# Patient Record
Sex: Male | Born: 1970
Health system: Southern US, Community
[De-identification: ages and names within clinical notes are randomized; demographics above are authoritative.]

---

## 2015-05-02 ENCOUNTER — Ambulatory Visit (INDEPENDENT_AMBULATORY_CARE_PROVIDER_SITE_OTHER): Payer: BLUE CROSS/BLUE SHIELD | Admitting: Emergency Medicine

## 2015-05-02 VITALS — BP 118/86 | HR 66 | Temp 98.1°F | Resp 16 | Ht 72.0 in | Wt 173.0 lb

## 2015-05-02 DIAGNOSIS — H6123 Impacted cerumen, bilateral: Secondary | ICD-10-CM

## 2015-05-02 NOTE — Progress Notes (Signed)
Subjective:  Patient ID: Samuel Kim, male    DOB: [birth year]  Age: 44 y.o. MRN: [id number]  CC: Ear Fullness   HPI Samuel PanningKevin Barretto presents  for decreased hearing in his left ear. He denies any drainage fever chills nasal congestion postnasal drip cough. Has a history of prior cerumen impactions and has attempted to irrigate his left ear with peroxide water. He's had no improvement. He has no pain in his ear.  History [name] has no past medical history on file.   He has no past surgical history on file.   His  family history is not on file.  He   reports that he has been smoking.  He does not have any smokeless tobacco history on file. His alcohol and drug histories are not on file.  No outpatient prescriptions prior to visit.   No facility-administered medications prior to visit.    History   Social History  . Marital Status: Single    Spouse Name: N/A  . Number of Children: N/A  . Years of Education: N/A   Social History Main Topics  . Smoking status: Current Every Day Smoker  . Smokeless tobacco: Not on file  . Alcohol Use: Not on file  . Drug Use: Not on file  . Sexual Activity: Not on file   Other Topics Concern  . None   Social History Narrative  . None     Review of Systems  Constitutional: Negative for fever, chills and appetite change.  HENT: Negative for congestion, ear pain, postnasal drip, sinus pressure and sore throat.   Eyes: Negative for pain and redness.  Respiratory: Negative for cough, shortness of breath and wheezing.   Cardiovascular: Negative for leg swelling.  Gastrointestinal: Negative for nausea, vomiting, abdominal pain, diarrhea, constipation and blood in stool.  Endocrine: Negative for polyuria.  Genitourinary: Negative for dysuria, urgency, frequency and flank pain.  Musculoskeletal: Negative for gait problem.  Skin: Negative for rash.  Neurological: Negative for weakness and headaches.  Psychiatric/Behavioral: Negative for  confusion and decreased concentration. The patient is not nervous/anxious.     Objective:  BP 118/86 mmHg  Pulse 66  Temp(Src) 98.1 F (36.7 C)  Resp 16  Ht 6' (1.829 m)  Wt 173 lb (78.472 kg)  BMI 23.46 kg/m2  SpO2 98%  Physical Exam  Constitutional: He is oriented to person, place, and time. He appears well-developed and well-nourished. No distress.  HENT:  Head: Normocephalic and atraumatic.  Right Ear: External ear normal.  Left Ear: External ear normal.  Nose: Nose normal.  Eyes: Conjunctivae and EOM are normal. Pupils are equal, round, and reactive to light. No scleral icterus.  Neck: Normal range of motion. Neck supple. No tracheal deviation present.  Cardiovascular: Normal rate, regular rhythm and normal heart sounds.   Pulmonary/Chest: Effort normal. No respiratory distress. He has no wheezes. He has no rales.  Abdominal: He exhibits no mass. There is no tenderness. There is no rebound and no guarding.  Musculoskeletal: He exhibits no edema.  Lymphadenopathy:    He has no cervical adenopathy.  Neurological: He is alert and oriented to person, place, and time. Coordination normal.  Skin: Skin is warm and dry. No rash noted.  Psychiatric: He has a normal mood and affect. His behavior is normal.   was noted to have bilateral cerumen impactions    Assessment & Plan:   [name] was seen today for ear fullness.  Diagnoses and all orders for this visit:  Cerumen impaction,  bilateral   Mr. Tapp does not currently have medications on file.  No orders of the defined types were placed in this encounter.   His ears were bilaterally irrigated atraumatically. No complications  Appropriate red flag conditions were discussed with the patient as well as actions that should be taken.  Patient expressed his understanding.  Follow-up: Return if symptoms worsen or fail to improve.  Carmelina Dane, MD

## 2015-05-02 NOTE — Patient Instructions (Signed)
Cerumen Impaction °A cerumen impaction is when the wax in your ear forms a plug. This plug usually causes reduced hearing. Sometimes it also causes an earache or dizziness. Removing a cerumen impaction can be difficult and painful. The wax sticks to the ear canal. The canal is sensitive and bleeds easily. If you try to remove a heavy wax buildup with a cotton tipped swab, you may push it in further. °Irrigation with water, suction, and small ear curettes may be used to clear out the wax. If the impaction is fixed to the skin in the ear canal, ear drops may be needed for a few days to loosen the wax. People who build up a lot of wax frequently can use ear wax removal products available in your local drugstore. °SEEK MEDICAL CARE IF:  °You develop an earache, increased hearing loss, or marked dizziness. °Document Released: 11/11/2004 Document Revised: 12/27/2011 Document Reviewed: 01/01/2010 °[employer]® Patient Information ©2015 [employer]. This information is not intended to replace advice given to you by your health care provider. Make sure you discuss any questions you have with your health care provider. ° °

## 2015-05-06 ENCOUNTER — Ambulatory Visit: Payer: Self-pay | Admitting: Adult Health

## 2018-12-04 DIAGNOSIS — M25551 Pain in right hip: Secondary | ICD-10-CM | POA: Diagnosis not present

## 2018-12-11 DIAGNOSIS — M25551 Pain in right hip: Secondary | ICD-10-CM | POA: Diagnosis not present

## 2018-12-14 ENCOUNTER — Ambulatory Visit: Payer: BLUE CROSS/BLUE SHIELD | Admitting: Physician Assistant

## 2018-12-20 DIAGNOSIS — M25551 Pain in right hip: Secondary | ICD-10-CM | POA: Diagnosis not present

## 2019-01-02 DIAGNOSIS — F17219 Nicotine dependence, cigarettes, with unspecified nicotine-induced disorders: Secondary | ICD-10-CM | POA: Diagnosis not present

## 2019-01-02 DIAGNOSIS — Z01818 Encounter for other preprocedural examination: Secondary | ICD-10-CM | POA: Diagnosis not present

## 2019-01-02 DIAGNOSIS — R030 Elevated blood-pressure reading, without diagnosis of hypertension: Secondary | ICD-10-CM | POA: Diagnosis not present

## 2019-01-02 DIAGNOSIS — M1611 Unilateral primary osteoarthritis, right hip: Secondary | ICD-10-CM | POA: Diagnosis not present

## 2019-03-29 DIAGNOSIS — I10 Essential (primary) hypertension: Secondary | ICD-10-CM | POA: Diagnosis not present

## 2019-03-29 DIAGNOSIS — R51 Headache: Secondary | ICD-10-CM | POA: Diagnosis not present

## 2019-03-29 DIAGNOSIS — R6883 Chills (without fever): Secondary | ICD-10-CM | POA: Diagnosis not present

## 2019-04-01 ENCOUNTER — Other Ambulatory Visit: Payer: Self-pay

## 2019-04-01 ENCOUNTER — Emergency Department (HOSPITAL_COMMUNITY): Payer: BC Managed Care – PPO

## 2019-04-01 ENCOUNTER — Encounter (HOSPITAL_COMMUNITY): Payer: Self-pay | Admitting: *Deleted

## 2019-04-01 ENCOUNTER — Emergency Department (HOSPITAL_COMMUNITY)
Admission: EM | Admit: 2019-04-01 | Discharge: 2019-04-01 | Disposition: A | Discharge status: Home / Self Care | Payer: BC Managed Care – PPO | Attending: Emergency Medicine | Admitting: Emergency Medicine

## 2019-04-01 DIAGNOSIS — F17200 Nicotine dependence, unspecified, uncomplicated: Secondary | ICD-10-CM | POA: Diagnosis not present

## 2019-04-01 DIAGNOSIS — R112 Nausea with vomiting, unspecified: Secondary | ICD-10-CM

## 2019-04-01 DIAGNOSIS — R945 Abnormal results of liver function studies: Secondary | ICD-10-CM | POA: Diagnosis not present

## 2019-04-01 DIAGNOSIS — R7989 Other specified abnormal findings of blood chemistry: Secondary | ICD-10-CM | POA: Diagnosis not present

## 2019-04-01 DIAGNOSIS — R5383 Other fatigue: Secondary | ICD-10-CM | POA: Diagnosis not present

## 2019-04-01 DIAGNOSIS — Z20828 Contact with and (suspected) exposure to other viral communicable diseases: Secondary | ICD-10-CM | POA: Insufficient documentation

## 2019-04-01 DIAGNOSIS — R5381 Other malaise: Secondary | ICD-10-CM | POA: Diagnosis not present

## 2019-04-01 DIAGNOSIS — R748 Abnormal levels of other serum enzymes: Secondary | ICD-10-CM | POA: Diagnosis not present

## 2019-04-01 DIAGNOSIS — R509 Fever, unspecified: Secondary | ICD-10-CM

## 2019-04-01 DIAGNOSIS — R51 Headache: Secondary | ICD-10-CM | POA: Diagnosis not present

## 2019-04-01 LAB — COMPREHENSIVE METABOLIC PANEL
ALT: 87 U/L — ABNORMAL HIGH (ref 0–44)
AST: 132 U/L — ABNORMAL HIGH (ref 15–41)
Albumin: 3 g/dL — ABNORMAL LOW (ref 3.5–5.0)
Alkaline Phosphatase: 247 U/L — ABNORMAL HIGH (ref 38–126)
Anion gap: 10 (ref 5–15)
BUN: 10 mg/dL (ref 6–20)
CO2: 26 mmol/L (ref 22–32)
Calcium: 8.2 mg/dL — ABNORMAL LOW (ref 8.9–10.3)
Chloride: 95 mmol/L — ABNORMAL LOW (ref 98–111)
Creatinine, Ser: 0.93 mg/dL (ref 0.61–1.24)
GFR calc Af Amer: >60 mL/min (ref >60)
GFR calc non Af Amer: >60 mL/min (ref >60)
Glucose, Bld: 104 mg/dL — ABNORMAL HIGH (ref 70–99)
Potassium: 3.8 mmol/L (ref 3.5–5.1)
Sodium: 131 mmol/L — ABNORMAL LOW (ref 135–145)
Total Bilirubin: 2.1 mg/dL — ABNORMAL HIGH (ref 0.3–1.2)
Total Protein: 6.1 g/dL — ABNORMAL LOW (ref 6.5–8.1)

## 2019-04-01 LAB — CBC WITH DIFFERENTIAL/PLATELET
Abs Immature Granulocytes: 0 10*3/uL (ref 0.00–0.07)
Basophils Absolute: 0 10*3/uL (ref 0.0–0.1)
Basophils Relative: 1 %
Eosinophils Absolute: 0 10*3/uL (ref 0.0–0.5)
Eosinophils Relative: 0 %
HCT: 49.9 % (ref 39.0–52.0)
Hemoglobin: 17.3 g/dL — ABNORMAL HIGH (ref 13.0–17.0)
Lymphocytes Relative: 28 %
Lymphs Abs: 1.3 10*3/uL (ref 0.7–4.0)
MCH: 33.7 pg (ref 26.0–34.0)
MCHC: 34.7 g/dL (ref 30.0–36.0)
MCV: 97.3 fL (ref 80.0–100.0)
Monocytes Absolute: 0.1 10*3/uL (ref 0.1–1.0)
Monocytes Relative: 3 %
Neutro Abs: 3.1 10*3/uL (ref 1.7–7.7)
Neutrophils Relative %: 68 %
Platelets: 41 10*3/uL — ABNORMAL LOW (ref 150–400)
RBC: 5.13 MIL/uL (ref 4.22–5.81)
RDW: 13.2 % (ref 11.5–15.5)
WBC: 4.6 10*3/uL (ref 4.0–10.5)
nRBC: 0 % (ref 0.0–0.2)

## 2019-04-01 LAB — ACETAMINOPHEN LEVEL: Acetaminophen (Tylenol), Serum: <10 ug/mL — ABNORMAL LOW (ref 10–30)

## 2019-04-01 LAB — PROTIME-INR
INR: 1.1 (ref 0.8–1.2)
Prothrombin Time: 13.8 seconds (ref 11.4–15.2)

## 2019-04-01 LAB — SARS CORONAVIRUS 2: SARS Coronavirus 2: NOT DETECTED

## 2019-04-01 LAB — LIPASE, BLOOD: Lipase: 22 U/L (ref 11–51)

## 2019-04-01 MED ORDER — SODIUM CHLORIDE 0.9 % IV BOLUS
1000.0000 mL | Freq: Once | INTRAVENOUS | Status: AC
  Administered 2019-04-01: 18:00:00 1000 mL via INTRAVENOUS

## 2019-04-01 NOTE — ED Provider Notes (Signed)
Physical Exam  BP 114/78 (BP Location: Right Arm)   Pulse 64   Temp 98.2 F (36.8 C) (Oral)   Resp 18   SpO2 97%   Assumed care from Dr. Venora Maples at [phone]. Please see his note for full H & P. Briefly, the patient is a 48 y.o. male with PMHx of  has no past medical history on file. here with subjective fever, headache, malaise.   Labs Reviewed  CBC WITH DIFFERENTIAL/PLATELET - Abnormal; Notable for the following components:      Result Value   Hemoglobin 17.3 (*)    Platelets 41 (*)    All other components within normal limits  COMPREHENSIVE METABOLIC PANEL - Abnormal; Notable for the following components:   Sodium 131 (*)    Chloride 95 (*)    Glucose, Bld 104 (*)    Calcium 8.2 (*)    Total Protein 6.1 (*)    Albumin 3.0 (*)    AST 132 (*)    ALT 87 (*)    Alkaline Phosphatase 247 (*)    Total Bilirubin 2.1 (*)    All other components within normal limits  SARS CORONAVIRUS 2 (HOSPITAL ORDER, [place] [facility])  LIPASE, BLOOD    Course of Care:   Physical Exam Vitals signs and nursing note reviewed.  Constitutional:      General: He is not in acute distress.    Appearance: He is well-developed. He is not diaphoretic.     Comments: Sitting comfortably in bed.  HENT:     Head: Normocephalic and atraumatic.  Eyes:     General:        Right eye: No discharge.        Left eye: No discharge.     Conjunctiva/sclera: Conjunctivae normal.     Comments: EOMs normal to gross examination.  Neck:     Musculoskeletal: Normal range of motion.  Cardiovascular:     Rate and Rhythm: Normal rate and regular rhythm.     Comments: Intact, 2+ radial pulse. Pulmonary:     Effort: Pulmonary effort is normal.     Breath sounds: Normal breath sounds. No wheezing.  Abdominal:     General: There is no distension.     Tenderness: There is no guarding or rebound.  Musculoskeletal: Normal range of motion.  Skin:    General: Skin is warm and dry.  Neurological:   Mental Status: He is alert.     Comments: Cranial nerves intact to gross observation. Patient moves extremities without difficulty.  Psychiatric:        Behavior: Behavior normal.        Thought Content: Thought content normal.        Judgment: Judgment normal.     ED Course/Procedures   Clinical Course as of Mar 31 2221  Sun Apr 01, 2019  2017 Hemoglobin(!): 17.3 [AM]  2017 Platelets(!): 41 [AM]  2116 Prothrombin Time: 13.8 [AM]  2215 SARS Coronavirus 2: NOT DETECTED [AM]  2215 Labs reviewed with Dr. Darl Householder including platelets, liver function and given patient's well appearance, he can follow up with OP after hepatitis panel is drawn.    [AM]    Clinical Course User Index [AM] Albesa Seen, PA-C    Procedures  MDM   This is a well-appearing 48 year old male presenting for fever, malaise, headache, and a couple episodes of nausea vomiting this week.  He is hemodynamically stable and afebrile here in the emergency department.  Work-up  demonstrating normal WBC count.  Hemoglobin elevated at 17.3, possibly secondary to volume depletion.  He is given fluids here in emergency department.  He has thrombocytopenia of 41. Presumed new since pt had labwork in March out of system.  WBC morphology showing plasmacytoid lymphs.  Patient has hyponatremia of 131 likely secondary to volume depletion.  This was repleted.  He has hypocalcemia of 8.2, hypoproteinemia of 6.1, hypoalbuminemia of 3.0.  AST is 132 and ALT 87.  Alkaline phosphatase 247.  Total bili 2.1.  He has a normal RUQ US.  Tylenol level checked and is normal.  SARS-CoV-2 test is normal.  Lipase normal.  PT/INR normal.  Patient reports feeling much better after fluids.    Case discussed with Dr. Chaney Mallingavid Yao, and patient felt appropriate for outpatient therapy after acute hepatitis panel ordered.  He will follow-up as an outpatient.  He is given return precautions for any intractable nausea or vomiting, or new or worsening symptoms.   Patient is in understanding and agrees with plan of care.       Elisha PonderMurray, Shakaya Bhullar B, PA-C 04/01/19 2226    Charlynne PanderYao, David Hsienta, MD 04/04/19 [phone]

## 2019-04-01 NOTE — ED Notes (Signed)
US at bedside

## 2019-04-01 NOTE — ED Provider Notes (Signed)
[facility] EMERGENCY DEPARTMENT Provider Note   CSN: [id number] Arrival date & time: 04/01/19  1620     History   Chief Complaint Chief Complaint  Patient presents with  . Fever  . Weakness    HPI Samuel Kim is a 48 y.o. male.     HPI 48 year old male presents the emergency department with complaints of generalized malaise and fever since Tuesday.  His fever abated after several days and today he has had recurrence of his chills.  He denies cough or congestion.  No shortness of breath.  Denies abdominal pain.  Reports poor appetite secondary to nausea associated with food.  Denies abdominal pain.  No vomiting.  Denies diarrhea.  No blood in the stool.  No sick contacts.  Otherwise healthy 48 year old male without significant past medical history.  No known sick contacts.   History reviewed. No pertinent past medical history.  There are no active problems to display for this patient.   History reviewed. No pertinent surgical history.      Home Medications    Prior to Admission medications   Not on File    Family History History reviewed. No pertinent family history.  Social History Social History   Tobacco Use  . Smoking status: Current Every Day Smoker  Substance Use Topics  . Alcohol use: Not on file  . Drug use: Not on file     Allergies   Patient has no known allergies.   Review of Systems Review of Systems  All other systems reviewed and are negative.    Physical Exam Updated Vital Signs BP 114/78 (BP Location: Right Arm)   Pulse 64   Temp 98.2 F (36.8 C) (Oral)   Resp 18   SpO2 97%   Physical Exam Vitals signs and nursing note reviewed.  Constitutional:      Appearance: He is well-developed.  HENT:     Head: Normocephalic and atraumatic.  Neck:     Musculoskeletal: Normal range of motion.  Cardiovascular:     Rate and Rhythm: Normal rate and regular rhythm.     Heart sounds: Normal heart sounds.   Pulmonary:     Effort: Pulmonary effort is normal. No respiratory distress.     Breath sounds: Normal breath sounds.  Abdominal:     General: There is no distension.     Palpations: Abdomen is soft.     Tenderness: There is no abdominal tenderness.  Musculoskeletal: Normal range of motion.  Skin:    General: Skin is warm and dry.  Neurological:     Mental Status: He is alert and oriented to person, place, and time.  Psychiatric:        Judgment: Judgment normal.      ED Treatments / Results  Labs (all labs ordered are listed, but only abnormal results are displayed) Labs Reviewed  CBC WITH DIFFERENTIAL/PLATELET  COMPREHENSIVE METABOLIC PANEL  LIPASE, BLOOD    EKG    Radiology No results found.  Procedures Procedures (including critical care time)  Medications Ordered in ED Medications  sodium chloride 0.9 % bolus 1,000 mL (has no administration in time range)     Initial Impression / Assessment and Plan / ED Course  I have reviewed the triage vital signs and the nursing notes.  Pertinent labs & imaging results that were available during my care of the patient were reviewed by me and considered in my medical decision making (see chart for details).  Labs chest x-ray and IV fluids now.  If all is reassuring the patient is overall well-appearing he likely can be discharged home.  Given the viral-like symptoms we will check him for COVID-19 with outpatient COVID-19 testing.  Home quarantine instructions will be given.  Marcy PanningKevin Kim was evaluated in Emergency Department on 04/01/2019 for the symptoms described in the history of present illness. He was evaluated in the context of the global COVID-19 pandemic, which necessitated consideration that the patient might be at risk for infection with the SARS-CoV-2 virus that causes COVID-19. Institutional protocols and algorithms that pertain to the evaluation of patients at risk for COVID-19 are in a state of rapid  change based on information released by regulatory bodies including the CDC and federal and state organizations. These policies and algorithms were followed during the patient's care in the ED.   Final Clinical Impressions(s) / ED Diagnoses   Final diagnoses:  None    ED Discharge Orders    None       Azalia Bilisampos, William, MD 04/01/19 [phone]

## 2019-04-01 NOTE — ED Notes (Signed)
ED Provider at bedside. 

## 2019-04-01 NOTE — ED Notes (Signed)
Patient is alert and orientedx4.  Patient was explained discharge instructions and they understood them with no questions.   

## 2019-04-01 NOTE — ED Triage Notes (Signed)
Pt reports onset of headache, fever, and fatigue on wed.

## 2019-04-01 NOTE — Discharge Instructions (Addendum)
Please see the information and instructions below regarding your visit.  Your diagnoses today include:  1. Fever in adult   2. Elevated liver enzymes   3. Non-intractable vomiting with nausea, unspecified vomiting type     You are diagnosed today with a viral respiratory illness. One of the major respiratory illnesses of concern circulating in our community is COVID-19. The Novel Coronavirus 2019, was first reported on in [place], [place] in late December 2019.  The outbreak was declared a public health emergency of international concern in January 2020 and on March 11th, 2020, the outbreak was declared a global pandemic.  The spread of this virus is now global with lots of media attention.  The virus has been named SARS-CoV-2 and the disease it causes has become known as coronavirus disease 2019 (COVID-19). Although symptoms can be variable, the predominant symptoms are fever, cough, and shortness of breath.   The incubation period (period during which the patient has the disease and can spread it to others but does not have symptoms of disease) is from 5-14 days.   [place] is considered to have "community spread" of COVID-19. This means that we are seeing cases of COVID-19 that we cannot connect to other known positive cases. Please refer to the information below on Home Care and from the [facility] to guide you on keeping your family safe and returning to normal activity.  Your test was negative today however I feel it is reasonable to continue to complete the rest of quarantine COVID.  Tests performed today include: See side panel of your discharge paperwork for testing performed today. Vital signs are listed at the bottom of these instructions.   Medications prescribed:    Take any prescribed medications only as prescribed, and any over the counter medications only as directed on the packaging.  DO NOT TAKE TYLENOL UNTIL WE FINISH EVALUATING YOUR LIVER.     You may take ibuprofen or naproxen.  Home care instructions:  Please follow any educational materials contained in this packet.   Since we do not have the ability to widely test patients for COVID-19, the Centers for Disease Control and Prevention (CDC) recommend the following guidelines currently for self-quarantine during a respiratory illness:   -At least 3 days (72 hours) have passed since recovery defined as resolution of fever without the use of fever-reducing medications and improvement in respiratory symptoms (e.g., cough, shortness of breath); and, -At least 10 days have passed since symptoms first appeared.  Your illness is contagious and can be spread to others, especially during the phase of illness during which you have a fever. It cannot be cured by antibiotics or other medicines. Take basic precautions such as washing your hands often, covering your mouth when you cough or sneeze, and avoiding public places where you could spread your illness to others.   Please continue drinking plenty of fluids.  Use over-the-counter medicines as needed as directed on packaging for symptom relief.  You may also use ibuprofen or tylenol as directed on packaging for pain or fever.  Do not take multiple medicines containing Tylenol or acetaminophen to avoid taking too much of this medication.  Follow-up instructions:   Return instructions:  Please return to the Emergency Department if you experience worsening symptoms.  RETURN IMMEDIATELY IF you develop shortness of breath, chest pain, confusion or altered mental status, a new rash, become dizzy, faint, or poorly responsive, or are unable to be cared for at  home. Some patients develop bacterial pneumonia as a result of viral respiratory infections. Signs and symptoms include return of fever, increased production of green or yellow phlegm, chest pain, shortness of breath. You need to be reevaluated if you experience these symptoms. Please return  if you have persistent vomiting and cannot keep down fluids or develop a fever that is not controlled by Acetaminophen (Tylenol) or Ibuprofen (Motrin). Please return if you have any other emergent concerns.   Additional Information:   Your vital signs today were: BP 113/79    Pulse 76    Temp 98.2 F (36.8 C) (Oral)    Resp 18    SpO2 94%  If your blood pressure (BP) was elevated on multiple readings during this visit above 130 for the top number or above 80 for the bottom number, please have this repeated by your primary care provider within one month. --------------  Thank you for allowing us to participate in your care today.        Individuals who are confirmed to have, or are being evaluated for, COVID-19 should follow the prevention steps below until a healthcare provider or local or state health department says they can return to normal activities.  Stay home except to get medical care You should restrict activities outside your home, except for getting medical care. Do not go to work, school, or public areas, and do not use public transportation or taxis.  Call ahead before visiting your doctor Before your medical appointment, call the healthcare provider and tell them that you have, or are being evaluated for, COVID-19 infection. This will help the healthcare providers office take steps to keep other people from getting infected. Ask your healthcare provider to call the local or state health department.  Monitor your symptoms Seek prompt medical attention if your illness is worsening (e.g., difficulty breathing). Before going to your medical appointment, call the healthcare provider and tell them that you have, or are being evaluated for, COVID-19 infection. Ask your healthcare provider to call the local or state health department.  Wear a facemask You should wear a facemask that covers your nose and mouth when you are in the same room with other people and when you  visit a healthcare provider. People who live with or visit you should also wear a facemask while they are in the same room with you.  Separate yourself from other people in your home As much as possible, you should stay in a different room from other people in your home. Also, you should use a separate bathroom, if available.  Avoid sharing household items You should not share dishes, drinking glasses, cups, eating utensils, towels, bedding, or other items with other people in your home. After using these items, you should wash them thoroughly with soap and water.  Cover your coughs and sneezes Cover your mouth and nose with a tissue when you cough or sneeze, or you can cough or sneeze into your sleeve. Throw used tissues in a lined trash can, and immediately wash your hands with soap and water for at least 20 seconds or use an alcohol-based hand rub.  Wash your Union Pacific Corporationhands Wash your hands often and thoroughly with soap and water for at least 20 seconds. You can use an alcohol-based hand sanitizer if soap and water are not available and if your hands are not visibly dirty. Avoid touching your eyes, nose, and mouth with unwashed hands.   Prevention Steps for Caregivers and Household Members of Individuals Confirmed  to have, or Being Evaluated for, COVID-19 Infection Being Cared for in the Home  If you live with, or provide care at home for, a person confirmed to have, or being evaluated for, COVID-19 infection please follow these guidelines to prevent infection:  Follow healthcare providers instructions Make sure that you understand and can help the patient follow any healthcare provider instructions for all care.  Provide for the patients basic needs You should help the patient with basic needs in the home and provide support for getting groceries, prescriptions, and other personal needs.  Monitor the patients symptoms If they are getting sicker, call his or her medical provider and  tell them that the patient has, or is being evaluated for, COVID-19 infection. This will help the healthcare providers office take steps to keep other people from getting infected. Ask the healthcare provider to call the local or state health department.  Limit the number of people who have contact with the patient If possible, have only one caregiver for the patient. Other household members should stay in another home or place of residence. If this is not possible, they should stay in another room, or be separated from the patient as much as possible. Use a separate bathroom, if available. Restrict visitors who do not have an essential need to be in the home.  Keep older adults, very young children, and other sick people away from the patient Keep older adults, very young children, and those who have compromised immune systems or chronic health conditions away from the patient. This includes people with chronic heart, lung, or kidney conditions, diabetes, and cancer.  Ensure good ventilation Make sure that shared spaces in the home have good air flow, such as from an air conditioner or an opened window, weather permitting.  Wash your hands often Wash your hands often and thoroughly with soap and water for at least 20 seconds. You can use an alcohol based hand sanitizer if soap and water are not available and if your hands are not visibly dirty. Avoid touching your eyes, nose, and mouth with unwashed hands. Use disposable paper towels to dry your hands. If not available, use dedicated cloth towels and replace them when they become wet.  Wear a facemask and gloves Wear a disposable facemask at all times in the room and gloves when you touch or have contact with the patients blood, body fluids, and/or secretions or excretions, such as sweat, saliva, sputum, nasal mucus, vomit, urine, or feces.  Ensure the mask fits over your nose and mouth tightly, and do not touch it during use. Throw out  disposable facemasks and gloves after using them. Do not reuse. Wash your hands immediately after removing your facemask and gloves. If your personal clothing becomes contaminated, carefully remove clothing and launder. Wash your hands after handling contaminated clothing. Place all used disposable facemasks, gloves, and other waste in a lined container before disposing them with other household waste. Remove gloves and wash your hands immediately after handling these items.  Do not share dishes, glasses, or other household items with the patient Avoid sharing household items. You should not share dishes, drinking glasses, cups, eating utensils, towels, bedding, or other items with a patient who is confirmed to have, or being evaluated for, COVID-19 infection. After the person uses these items, you should wash them thoroughly with soap and water.  Wash laundry thoroughly Immediately remove and wash clothes or bedding that have blood, body fluids, and/or secretions or excretions, such as sweat, saliva,  sputum, nasal mucus, vomit, urine, or feces, on them. Wear gloves when handling laundry from the patient. Read and follow directions on labels of laundry or clothing items and detergent. In general, wash and dry with the warmest temperatures recommended on the label.  Clean all areas the individual has used often Clean all touchable surfaces, such as counters, tabletops, doorknobs, bathroom fixtures, toilets, phones, keyboards, tablets, and bedside tables, every day. Also, clean any surfaces that may have blood, body fluids, and/or secretions or excretions on them. Wear gloves when cleaning surfaces the patient has come in contact with. Use a diluted bleach solution (e.g., dilute bleach with 1 part bleach and 10 parts water) or a household disinfectant with a label that says EPA-registered for coronaviruses. To make a bleach solution at home, add 1 tablespoon of bleach to 1 quart (4 cups) of water.  For a larger supply, add  cup of bleach to 1 gallon (16 cups) of water. Read labels of cleaning products and follow recommendations provided on product labels. Labels contain instructions for safe and effective use of the cleaning product including precautions you should take when applying the product, such as wearing gloves or eye protection and making sure you have good ventilation during use of the product. Remove gloves and wash hands immediately after cleaning.  Monitor yourself for signs and symptoms of illness Caregivers and household members are considered close contacts, should monitor their health, and will be asked to limit movement outside of the home to the extent possible. Follow the monitoring steps for close contacts listed on the symptom monitoring form.   ? If you have additional questions, contact your local health department or call the epidemiologist on call at [phone] (available 24/7). ? This guidance is subject to change. For the most up-to-date guidance from [facility], please refer to their website: TripMetro.huhttps://www.cdc.gov/coronavirus/2019-ncov/hcp/guidance-prevent-spread.html

## 2019-04-03 LAB — HEPATITIS PANEL, ACUTE
HCV Ab: <0.1 s/co ratio (ref 0.0–0.9)
Hep A IgM: NEGATIVE
Hep B C IgM: NEGATIVE
Hepatitis B Surface Ag: NEGATIVE

## 2019-04-09 ENCOUNTER — Encounter: Payer: Self-pay | Admitting: Family Medicine

## 2019-04-09 ENCOUNTER — Ambulatory Visit: Payer: BC Managed Care – PPO | Admitting: Family Medicine

## 2019-04-09 ENCOUNTER — Other Ambulatory Visit: Payer: Self-pay

## 2019-04-09 VITALS — BP 138/79 | HR 67 | Temp 98.4°F | Resp 16 | Ht 72.0 in | Wt 182.4 lb

## 2019-04-09 DIAGNOSIS — R12 Heartburn: Secondary | ICD-10-CM

## 2019-04-09 DIAGNOSIS — Z13220 Encounter for screening for lipoid disorders: Secondary | ICD-10-CM

## 2019-04-09 DIAGNOSIS — E860 Dehydration: Secondary | ICD-10-CM | POA: Diagnosis not present

## 2019-04-09 DIAGNOSIS — R7401 Elevation of levels of liver transaminase levels: Secondary | ICD-10-CM

## 2019-04-09 DIAGNOSIS — R740 Nonspecific elevation of levels of transaminase and lactic acid dehydrogenase [LDH]: Secondary | ICD-10-CM

## 2019-04-09 LAB — POCT URINALYSIS DIP (MANUAL ENTRY)
Blood, UA: NEGATIVE
Glucose, UA: NEGATIVE mg/dL
Leukocytes, UA: NEGATIVE
Nitrite, UA: NEGATIVE
Protein Ur, POC: NEGATIVE mg/dL
Spec Grav, UA: >=1.03 — AB (ref 1.010–1.025)
Urobilinogen, UA: 0.2 E.U./dL
pH, UA: 5 (ref 5.0–8.0)

## 2019-04-09 NOTE — Patient Instructions (Addendum)
   If you have lab work done today you will be contacted with your lab results within the next 2 weeks.  If you have not heard from us then please contact us. The fastest way to get your results is to register for My Chart.   IF you received an x-ray today, you will receive an invoice from [facility]. Please contact [facility] at [phone] with questions or concerns regarding your invoice.   IF you received labwork today, you will receive an invoice from [facility]. Please contact [facility] at [phone] with questions or concerns regarding your invoice.   Our billing staff will not be able to assist you with questions regarding bills from these companies.  You will be contacted with the lab results as soon as they are available. The fastest way to get your results is to activate your My Chart account. Instructions are located on the last page of this paperwork. If you have not heard from us regarding the results in 2 weeks, please contact this office.     Cholesterol Cholesterol is a white, waxy, fat-like substance that is needed by the human body in small amounts. The liver makes all the cholesterol we need. Cholesterol is carried from the liver by the blood through the blood vessels. Deposits of cholesterol (plaques) may build up on blood vessel (artery) walls. Plaques make the arteries narrower and stiffer. Cholesterol plaques increase the risk for heart attack and stroke. You cannot feel your cholesterol level even if it is very high. The only way to know that it is high is to have a blood test. Once you know your cholesterol levels, you should keep a record of the test results. Work with your health care provider to keep your levels in the desired range. What do the results mean?  Total cholesterol is a rough measure of all the cholesterol in your blood.  LDL (low-density lipoprotein) is the "bad" cholesterol. This is the type that causes plaque to build up on the  artery walls. You want this level to be low.  HDL (high-density lipoprotein) is the "good" cholesterol because it cleans the arteries and carries the LDL away. You want this level to be high.  Triglycerides are fat that the body can either burn for energy or store. High levels are closely linked to heart disease. What are the desired levels of cholesterol?  Total cholesterol below 200.  LDL below 100 for people who are at risk, below 70 for people at very high risk.  HDL above 40 is good. A level of 60 or higher is considered to be protective against heart disease.  Triglycerides below 150. How can I lower my cholesterol? Diet Follow your diet program as told by your health care provider.  Choose fish or white meat chicken and turkey, roasted or baked. Limit fatty cuts of red meat, fried foods, and processed meats, such as sausage and lunch meats.  Eat lots of fresh fruits and vegetables.  Choose whole grains, beans, pasta, potatoes, and cereals.  Choose olive oil, corn oil, or canola oil, and use only small amounts.  Avoid butter, mayonnaise, shortening, or palm kernel oils.  Avoid foods with trans fats.  Drink skim or nonfat milk and eat low-fat or nonfat yogurt and cheeses. Avoid whole milk, cream, ice cream, egg yolks, and full-fat cheeses.  Healthier desserts include angel food cake, ginger snaps, animal crackers, hard candy, popsicles, and low-fat or nonfat frozen yogurt. Avoid pastries, cakes, pies, and cookies.    Exercise  Follow your exercise program as told by your health care provider. A regular program: ? Helps to decrease LDL and raise HDL. ? Helps with weight control.  Do things that increase your activity level, such as gardening, walking, and taking the stairs.  Ask your health care provider about ways that you can be more active in your daily life. Medicine  Take over-the-counter and prescription medicines only as told by your health care  provider. ? Medicine may be prescribed by your health care provider to help lower cholesterol and decrease the risk for heart disease. This is usually done if diet and exercise have failed to bring down cholesterol levels. ? If you have several risk factors, you may need medicine even if your levels are normal. This information is not intended to replace advice given to you by your health care provider. Make sure you discuss any questions you have with your health care provider. Document Released: 06/29/2001 Document Revised: 05/01/2016 Document Reviewed: 04/03/2016 Elsevier Interactive Patient Education  2019 Elsevier Inc.  

## 2019-04-09 NOTE — Progress Notes (Signed)
New Patient Office Visit  Subjective:  Patient ID: Samuel Kim, male    DOB: [birth year]  Age: 48 y.o. MRN: [id number]  CC:  Chief Complaint  Patient presents with  . Hospitalization Follow-up    per pt feeling better since getting iv fluids, per pt he took a lot of goodie powders, tylenol and ibuprofen and thinks this caused his liver enzymes to be elevated    HPI Samuel Kim presents for   Pt had a fever and vomiting on 04/01/2019 He went to [facility] ER  States that he vomited twice He started off having headache and a fever and took handfull of pills. His headache has resolved He took some goodies and bc powder and states that he did not want to take too much He was told to alternate ibuprofe and tylenol His fever was better He did not have much energy He states that he was feeling sick after being out and mowing the yard   He denies chest pains, diarrhea, no hematuria  He states that his urine was a copper color  Since 04/01/2019 He is drinking water, gatorade and stopped the pedialyte   No past medical history on file.  No past surgical history on file.  No family history on file.  Social History   Socioeconomic History  . Marital status: Single    Spouse name: Not on file  . Number of children: Not on file  . Years of education: Not on file  . Highest education level: Not on file  Occupational History  . Not on file  Social Needs  . Financial resource strain: Not on file  . Food insecurity    Worry: Not on file    Inability: Not on file  . Transportation needs    Medical: Not on file    Non-medical: Not on file  Tobacco Use  . Smoking status: Current Every Day Smoker  . Smokeless tobacco: Never Used  Substance and Sexual Activity  . Alcohol use: Not on file  . Drug use: Not on file  . Sexual activity: Not on file  Lifestyle  . Physical activity    Days per week: Not on file    Minutes per session: Not on file  . Stress: Not on file   Relationships  . Social Herbalist on phone: Not on file    Gets together: Not on file    Attends religious service: Not on file    Active member of club or organization: Not on file    Attends meetings of clubs or organizations: Not on file    Relationship status: Not on file  . Intimate partner violence    Fear of current or ex partner: Not on file    Emotionally abused: Not on file    Physically abused: Not on file    Forced sexual activity: Not on file  Other Topics Concern  . Not on file  Social History Narrative  . Not on file    ROS Review of Systems Review of Systems  Constitutional: Negative for activity change, appetite change, chills and fever.  HENT: Negative for congestion, nosebleeds, trouble swallowing and voice change.   Respiratory: Negative for cough, shortness of breath and wheezing.   Gastrointestinal: see hpi  Genitourinary: Negative for difficulty urinating, dysuria, flank pain and hematuria.   See hpi Musculoskeletal: Negative for back pain, joint swelling and neck pain.  Neurological: Negative for dizziness, speech difficulty, light-headedness and numbness.  See HPI. All other review of systems negative.   Objective:   Today's Vitals: BP 138/79 (BP Location: Right Arm, Patient Position: Sitting, Cuff Size: Normal)   Pulse 67   Temp 98.4 F (36.9 C) (Oral)   Resp 16   Ht 6' (1.829 m)   Wt 182 lb 6.4 oz (82.7 kg)   SpO2 96%   BMI 24.74 kg/m   Physical Exam  Physical Exam  Constitutional: Oriented to person, place, and time. Appears well-developed and well-nourished.  HENT:  Head: Normocephalic and atraumatic.  Eyes: Conjunctivae and EOM are normal.  Cardiovascular: Normal rate, regular rhythm, normal heart sounds and intact distal pulses.  No murmur heard. Pulmonary/Chest: Effort normal and breath sounds normal. No stridor. No respiratory distress. Has no wheezes.  Abdomen: nondistended, normoactive bs, nontender, no masses   Neurological: Is alert and oriented to person, place, and time.  Skin: Skin is warm. Capillary refill takes less than 2 seconds.  Psychiatric: Has a normal mood and affect. Behavior is normal. Judgment and thought content normal.    FINDINGS: Gallbladder:  No gallstones or wall thickening visualized. No sonographic Murphy sign noted by sonographer.  Common bile duct:  Diameter: 2.8 mm, within normal limits  Liver:  No focal lesion identified. Within normal limits in parenchymal echogenicity. Portal vein is patent on color Doppler imaging with normal direction of blood flow towards the liver.  IMPRESSION: Negative right upper quadrant ultrasound.   Electronically Signed   By: San Morelle M.D.   On: 04/01/2019 21:32  Problem List Items Addressed This Visit    None    Visit Diagnoses    Elevated transaminase level    -  Primary Reviewed labs such as hepatitis labs also reviewed CMP Discussed that will also check lipids    Relevant Orders   CMP14+EGFR   CBC   Lipid panel   Heartburn    -  Discussed that he should avoid aspirin for now, may need antacids   Relevant Orders   CMP14+EGFR   CBC   Dehydration    -  Discussed that will need to assess lytes   Relevant Orders   CMP14+EGFR   CBC   POCT urinalysis dipstick   Screening, lipid    - will screen today, pt does not go to the doctor   Relevant Orders   Lipid panel      Outpatient Encounter Medications as of 04/09/2019  Medication Sig  . acetaminophen (TYLENOL) 500 MG tablet Take 1,000 mg by mouth every 6 (six) hours as needed for fever or headache (pain).  . Aspirin-Acetaminophen-Caffeine (GOODY HEADACHE PO) Take 1 packet by mouth 4 (four) times daily as needed (pain/headache/fever).  . Aspirin-Salicylamide-Caffeine (BC HEADACHE POWDER PO) Take 1 packet by mouth 4 (four) times daily as needed (pain/headache/fever).  Marland Kitchen ibuprofen (ADVIL) 200 MG tablet Take 800 mg by mouth every 6 (six) hours as  needed for fever or headache (pain).   No facility-administered encounter medications on file as of 04/09/2019.     Follow-up: No follow-ups on file.   Forrest Moron, MD

## 2019-04-10 LAB — CMP14+EGFR
ALT: 28 IU/L (ref 0–44)
AST: 21 IU/L (ref 0–40)
Albumin/Globulin Ratio: 1.8 (ref 1.2–2.2)
Albumin: 4.2 g/dL (ref 4.0–5.0)
Alkaline Phosphatase: 171 IU/L — ABNORMAL HIGH (ref 39–117)
BUN/Creatinine Ratio: 11 (ref 9–20)
BUN: 8 mg/dL (ref 6–24)
Bilirubin Total: 0.7 mg/dL (ref 0.0–1.2)
CO2: 20 mmol/L (ref 20–29)
Calcium: 8.8 mg/dL (ref 8.7–10.2)
Chloride: 105 mmol/L (ref 96–106)
Creatinine, Ser: 0.72 mg/dL — ABNORMAL LOW (ref 0.76–1.27)
GFR calc Af Amer: 128 mL/min/{1.73_m2} (ref >59)
GFR calc non Af Amer: 111 mL/min/{1.73_m2} (ref >59)
Globulin, Total: 2.3 g/dL (ref 1.5–4.5)
Glucose: 80 mg/dL (ref 65–99)
Potassium: 4.2 mmol/L (ref 3.5–5.2)
Sodium: 141 mmol/L (ref 134–144)
Total Protein: 6.5 g/dL (ref 6.0–8.5)

## 2019-04-10 LAB — LIPID PANEL
Chol/HDL Ratio: 6.6 ratio — ABNORMAL HIGH (ref 0.0–5.0)
Cholesterol, Total: 158 mg/dL (ref 100–199)
HDL: 24 mg/dL — ABNORMAL LOW (ref >39)
LDL Calculated: 57 mg/dL (ref 0–99)
Triglycerides: 386 mg/dL — ABNORMAL HIGH (ref 0–149)
VLDL Cholesterol Cal: 77 mg/dL — ABNORMAL HIGH (ref 5–40)

## 2019-04-10 LAB — CBC
Hematocrit: 48.2 % (ref 37.5–51.0)
Hemoglobin: 16.6 g/dL (ref 13.0–17.7)
MCH: 33.5 pg — ABNORMAL HIGH (ref 26.6–33.0)
MCHC: 34.4 g/dL (ref 31.5–35.7)
MCV: 97 fL (ref 79–97)
Platelets: 208 10*3/uL (ref 150–450)
RBC: 4.95 x10E6/uL (ref 4.14–5.80)
RDW: 13.2 % (ref 11.6–15.4)
WBC: 8.7 10*3/uL (ref 3.4–10.8)

## 2019-06-23 DIAGNOSIS — H04123 Dry eye syndrome of bilateral lacrimal glands: Secondary | ICD-10-CM | POA: Diagnosis not present

## 2019-06-23 DIAGNOSIS — H40033 Anatomical narrow angle, bilateral: Secondary | ICD-10-CM | POA: Diagnosis not present

## 2019-08-24 ENCOUNTER — Other Ambulatory Visit: Payer: Self-pay

## 2019-08-24 DIAGNOSIS — Z20822 Contact with and (suspected) exposure to covid-19: Secondary | ICD-10-CM

## 2019-08-25 LAB — NOVEL CORONAVIRUS, NAA: SARS-CoV-2, NAA: NOT DETECTED

## 2019-10-22 ENCOUNTER — Encounter (HOSPITAL_COMMUNITY): Payer: Self-pay | Admitting: Emergency Medicine

## 2019-10-22 ENCOUNTER — Emergency Department (HOSPITAL_COMMUNITY): Payer: BC Managed Care – PPO

## 2019-10-22 ENCOUNTER — Emergency Department (HOSPITAL_COMMUNITY)
Admission: EM | Admit: 2019-10-22 | Discharge: 2019-10-23 | Disposition: A | Discharge status: Home / Self Care | Payer: BC Managed Care – PPO | Attending: Emergency Medicine | Admitting: Emergency Medicine

## 2019-10-22 DIAGNOSIS — Z7982 Long term (current) use of aspirin: Secondary | ICD-10-CM | POA: Diagnosis not present

## 2019-10-22 DIAGNOSIS — K219 Gastro-esophageal reflux disease without esophagitis: Secondary | ICD-10-CM | POA: Diagnosis not present

## 2019-10-22 DIAGNOSIS — R0789 Other chest pain: Secondary | ICD-10-CM

## 2019-10-22 DIAGNOSIS — R0602 Shortness of breath: Secondary | ICD-10-CM | POA: Diagnosis not present

## 2019-10-22 DIAGNOSIS — F17210 Nicotine dependence, cigarettes, uncomplicated: Secondary | ICD-10-CM | POA: Insufficient documentation

## 2019-10-22 LAB — CBC
HCT: 49.7 % (ref 39.0–52.0)
Hemoglobin: 17.3 g/dL — ABNORMAL HIGH (ref 13.0–17.0)
MCH: 34.7 pg — ABNORMAL HIGH (ref 26.0–34.0)
MCHC: 34.8 g/dL (ref 30.0–36.0)
MCV: 99.6 fL (ref 80.0–100.0)
Platelets: 153 10*3/uL (ref 150–400)
RBC: 4.99 MIL/uL (ref 4.22–5.81)
RDW: 12.9 % (ref 11.5–15.5)
WBC: 7.5 10*3/uL (ref 4.0–10.5)
nRBC: 0 % (ref 0.0–0.2)

## 2019-10-22 LAB — BASIC METABOLIC PANEL
Anion gap: 12 (ref 5–15)
BUN: 6 mg/dL (ref 6–20)
CO2: 24 mmol/L (ref 22–32)
Calcium: 9.3 mg/dL (ref 8.9–10.3)
Chloride: 100 mmol/L (ref 98–111)
Creatinine, Ser: 0.88 mg/dL (ref 0.61–1.24)
GFR calc Af Amer: >60 mL/min (ref >60)
GFR calc non Af Amer: >60 mL/min (ref >60)
Glucose, Bld: 106 mg/dL — ABNORMAL HIGH (ref 70–99)
Potassium: 3.4 mmol/L — ABNORMAL LOW (ref 3.5–5.1)
Sodium: 136 mmol/L (ref 135–145)

## 2019-10-22 LAB — TROPONIN I (HIGH SENSITIVITY): Troponin I (High Sensitivity): 3 ng/L (ref <18)

## 2019-10-22 MED ORDER — SODIUM CHLORIDE 0.9% FLUSH: 3.0000 mL | Freq: Once | INTRAVENOUS | Status: DC

## 2019-10-22 NOTE — ED Triage Notes (Signed)
Pt. Stated, I feel like Im SOB and some chest tightness , started about a week ago. Nothing causes to be better or worse.

## 2019-10-23 LAB — TROPONIN I (HIGH SENSITIVITY): Troponin I (High Sensitivity): 4 ng/L (ref <18)

## 2019-10-23 NOTE — ED Notes (Signed)
Pt verbalized understanding of discharge paperwork and follow-up care.  °

## 2019-10-23 NOTE — ED Provider Notes (Signed)
[facility] EMERGENCY DEPARTMENT Provider Note   CSN: [id number] Arrival date & time: 10/22/19  1833     History Chief Complaint  Patient presents with   Shortness of Breath   Chest Pain    Samuel Kim is a 49 y.o. male with a past medical history of tobacco use presenting to the ED with a chief complaint of chest pain.  About 1 week ago started having intermittent burning sensation in his chest with associated chest tightness.  He initially thought it was due to reflux and started taking Prilosec.  He believes the Prilosec was helping until yesterday when his chest tightness became constant. He also picked up an epinephrine inhaler that was available over-the-counter with some improvement in his symptoms.  He reports shortness of breath when the chest pain worsens.  Denies any history of DVT, PE, MI, recent immobilization or surgeries, fever, abdominal pain, nausea, vomiting.  He denies any drug use and reports occasional alcohol use.  Denies family history of CAD at a young age.  HPI     History reviewed. No pertinent past medical history.  There are no problems to display for this patient.   History reviewed. No pertinent surgical history.     No family history on file.  Social History   Tobacco Use   Smoking status: Current Every Day Smoker   Smokeless tobacco: Never Used  Substance Use Topics   Alcohol use: Yes    Alcohol/week: 0.0 standard drinks   Drug use: Not Currently    Home Medications Prior to Admission medications   Medication Sig Start Date End Date Taking? Authorizing Provider  acetaminophen (TYLENOL) 500 MG tablet Take 1,000 mg by mouth every 6 (six) hours as needed for fever or headache (pain).    [provider]  Aspirin-Acetaminophen-Caffeine (GOODY HEADACHE PO) Take 1 packet by mouth 4 (four) times daily as needed (pain/headache/fever).    [provider]  Aspirin-Salicylamide-Caffeine (BC HEADACHE POWDER  PO) Take 1 packet by mouth 4 (four) times daily as needed (pain/headache/fever).    [provider]  ibuprofen (ADVIL) 200 MG tablet Take 800 mg by mouth every 6 (six) hours as needed for fever or headache (pain).    [provider]    Allergies    Patient has no known allergies.  Review of Systems   Review of Systems  Constitutional: Negative for appetite change, chills and fever.  HENT: Negative for ear pain, rhinorrhea, sneezing and sore throat.   Eyes: Negative for photophobia and visual disturbance.  Respiratory: Positive for chest tightness and shortness of breath. Negative for cough and wheezing.   Cardiovascular: Positive for chest pain. Negative for palpitations.  Gastrointestinal: Negative for abdominal pain, blood in stool, constipation, diarrhea, nausea and vomiting.  Genitourinary: Negative for dysuria, hematuria and urgency.  Musculoskeletal: Negative for myalgias.  Skin: Negative for rash.  Neurological: Negative for dizziness, weakness and light-headedness.    Physical Exam Updated Vital Signs BP (!) 156/114 (BP Location: Left Arm)    Pulse 61    Temp 97.8 F (36.6 C) (Oral)    Resp 18    SpO2 99%   Physical Exam Vitals and nursing note reviewed.  Constitutional:      General: He is not in acute distress.    Appearance: He is well-developed.     Comments: Speaking in complete sentences without difficulty.  HENT:     Head: Normocephalic and atraumatic.     Nose: Nose normal.  Eyes:  General: No scleral icterus.       Left eye: No discharge.     Conjunctiva/sclera: Conjunctivae normal.  Cardiovascular:     Rate and Rhythm: Normal rate and regular rhythm.     Heart sounds: Normal heart sounds. No murmur. No friction rub. No gallop.   Pulmonary:     Effort: Pulmonary effort is normal. No respiratory distress.     Breath sounds: Normal breath sounds.  Abdominal:     General: Bowel sounds are normal. There is no distension.     Palpations:  Abdomen is soft.     Tenderness: There is no abdominal tenderness. There is no guarding.  Musculoskeletal:        General: Normal range of motion.     Cervical back: Normal range of motion and neck supple.     Right lower leg: No tenderness.     Left lower leg: No tenderness.     Comments: No lower extremity edema, erythema or calf tenderness bilaterally.  Skin:    General: Skin is warm and dry.     Findings: No rash.  Neurological:     Mental Status: He is alert.     Motor: No abnormal muscle tone.     Coordination: Coordination normal.     ED Results / Procedures / Treatments   Labs (all labs ordered are listed, but only abnormal results are displayed) Labs Reviewed  BASIC METABOLIC PANEL - Abnormal; Notable for the following components:      Result Value   Potassium 3.4 (*)    Glucose, Bld 106 (*)    All other components within normal limits  CBC - Abnormal; Notable for the following components:   Hemoglobin 17.3 (*)    MCH 34.7 (*)    All other components within normal limits  TROPONIN I (HIGH SENSITIVITY)  TROPONIN I (HIGH SENSITIVITY)    EKG EKG Interpretation  Date/Time:  Tuesday October 23 2019 10:55:16 EST Ventricular Rate:  58 PR Interval:    QRS Duration: 98 QT Interval:  450 QTC Calculation: 442 R Axis:   84 Text Interpretation: Sinus rhythm No previous tracing Confirmed by Lajean Saver [id number]) on 10/23/2019 11:09:45 AM   Radiology DG Chest 2 View  Result Date: 10/22/2019 CLINICAL DATA:  Shortness of breath and chest tightness. EXAM: CHEST - 2 VIEW COMPARISON:  April 01, 2019 FINDINGS: The heart size and mediastinal contours are within normal limits. Both lungs are clear. The visualized skeletal structures are unremarkable. IMPRESSION: No active cardiopulmonary disease. Electronically Signed   By: Virgina Norfolk M.D.   On: 10/22/2019 19:41    Procedures Procedures (including critical care time)  Medications Ordered in ED Medications  sodium  chloride flush (NS) 0.9 % injection 3 mL (has no administration in time range)    ED Course  I have reviewed the triage vital signs and the nursing notes.  Pertinent labs & imaging results that were available during my care of the patient were reviewed by me and considered in my medical decision making (see chart for details).    MDM Rules/Calculators/A&P                      49 year old male with a past medical history of tobacco abuse presenting to the ED with a chief complaint of chest pain and chest tightness.  Symptoms have been going on for the past week but got worse yesterday.  He reports shortness of breath when he has his chest  pain.  Noticed that it did get worse after eating.  He has had some improvement with Prilosec as well as an over-the-counter inhaler.  He does not currently have a PCP.  Denies family history of CAD at a young age.  On my exam he is overall well-appearing.  No lower extremity edema, erythema or calf tenderness are concerning for DVT.  Lungs are clear to auscultation bilaterally.  His vital signs are within normal limits, he is not tachycardic, tachypneic or hypoxic.  However he is persistently hypertensive here.  He states he was told in the past that he history of hypertension but has never been put on any antihypertensives.  States that intermittently he will have normal blood pressures in the office and at home.  EKG shows normal sinus rhythm without ischemic changes.  Chest x-ray is unremarkable.  Initial delta troponin are both negative.  CBC and BMP are unremarkable as well.  Patient is symptom-free today and no pain noted on my evaluation so I do not see much benefit from trialing with GI cocktail or NSAIDs.  Please that his symptoms could be due to reflux as they do occur after he eats. He is low risk for ACS, PERC negative so I doubt these as the cause of his pain.  We will have him follow-up with PCP and return for worsening symptoms.  Patient is  hemodynamically stable, in NAD, and able to ambulate in the ED. Evaluation does not show pathology that would require ongoing emergent intervention or inpatient treatment. I explained the diagnosis to the patient. Pain has been managed and has no complaints prior to discharge. Patient is comfortable with above plan and is stable for discharge at this time. All questions were answered prior to disposition. Strict return precautions for returning to the ED were discussed. Encouraged follow up with PCP.   An After Visit Summary was printed and given to the patient.   Portions of this note were generated with Scientist, clinical (histocompatibility and immunogenetics). Dictation errors may occur despite best attempts at proofreading.  Final Clinical Impression(s) / ED Diagnoses Final diagnoses:  Chest wall pain  Gastroesophageal reflux disease, unspecified whether esophagitis present    Rx / DC Orders ED Discharge Orders    None       Dietrich Pates, PA-C 10/23/19 1137    Cathren Laine, MD 10/23/19 1441

## 2019-10-23 NOTE — Discharge Instructions (Addendum)
It is important for you to establish care with a primary care provider for further evaluation and preventative management. You can take the Prilosec with Maalox as needed. Return to the ED if you start to experience worsening chest pain, shortness of breath, vomiting or coughing up blood, leg swelling.

## 2019-10-26 ENCOUNTER — Ambulatory Visit: Payer: BC Managed Care – PPO | Admitting: Family Medicine

## 2019-10-26 ENCOUNTER — Ambulatory Visit (INDEPENDENT_AMBULATORY_CARE_PROVIDER_SITE_OTHER): Payer: BC Managed Care – PPO

## 2019-10-26 ENCOUNTER — Other Ambulatory Visit: Payer: Self-pay

## 2019-10-26 ENCOUNTER — Encounter: Payer: Self-pay | Admitting: Family Medicine

## 2019-10-26 VITALS — BP 140/90 | HR 77 | Temp 98.3°F | Ht 72.0 in | Wt 173.6 lb

## 2019-10-26 DIAGNOSIS — R0781 Pleurodynia: Secondary | ICD-10-CM

## 2019-10-26 DIAGNOSIS — K219 Gastro-esophageal reflux disease without esophagitis: Secondary | ICD-10-CM | POA: Diagnosis not present

## 2019-10-26 DIAGNOSIS — R079 Chest pain, unspecified: Secondary | ICD-10-CM | POA: Diagnosis not present

## 2019-10-26 DIAGNOSIS — R0789 Other chest pain: Secondary | ICD-10-CM

## 2019-10-26 DIAGNOSIS — R072 Precordial pain: Secondary | ICD-10-CM | POA: Diagnosis not present

## 2019-10-26 MED ORDER — OMEPRAZOLE 20 MG PO CPDR: 20.0000 mg | DELAYED_RELEASE_CAPSULE | Freq: Every day | ORAL | 1 refills | Status: DC

## 2019-10-26 NOTE — Patient Instructions (Addendum)
   If you have lab work done today you will be contacted with your lab results within the next 2 weeks.  If you have not heard from us then please contact us. The fastest way to get your results is to register for My Chart.   IF you received an x-ray today, you will receive an invoice from [facility]. Please contact [facility] at [phone] with questions or concerns regarding your invoice.   IF you received labwork today, you will receive an invoice from [facility]. Please contact [facility] at [phone] with questions or concerns regarding your invoice.   Our billing staff will not be able to assist you with questions regarding bills from these companies.  You will be contacted with the lab results as soon as they are available. The fastest way to get your results is to activate your My Chart account. Instructions are located on the last page of this paperwork. If you have not heard from us regarding the results in 2 weeks, please contact this office.      Food Choices for Gastroesophageal Reflux Disease, Adult When you have gastroesophageal reflux disease (GERD), the foods you eat and your eating habits are very important. Choosing the right foods can help ease the discomfort of GERD. Consider working with a diet and nutrition specialist (dietitian) to help you make healthy food choices. What general guidelines should I follow?  Eating plan  Choose healthy foods low in fat, such as fruits, vegetables, whole grains, low-fat dairy products, and lean meat, fish, and poultry.  Eat frequent, small meals instead of three large meals each day. Eat your meals slowly, in a relaxed setting. Avoid bending over or lying down until 2-3 hours after eating.  Limit high-fat foods such as fatty meats or fried foods.  Limit your intake of oils, butter, and shortening to less than 8 teaspoons each day.  Avoid the following: ? Foods that cause symptoms. These may be different  for different people. Keep a food diary to keep track of foods that cause symptoms. ? Alcohol. ? Drinking large amounts of liquid with meals. ? Eating meals during the 2-3 hours before bed.  Cook foods using methods other than frying. This may include baking, grilling, or broiling. Lifestyle  Maintain a healthy weight. Ask your health care provider what weight is healthy for you. If you need to lose weight, work with your health care provider to do so safely.  Exercise for at least 30 minutes on 5 or more days each week, or as told by your health care provider.  Avoid wearing clothes that fit tightly around your waist and chest.  Do not use any products that contain nicotine or tobacco, such as cigarettes and e-cigarettes. If you need help quitting, ask your health care provider.  Sleep with the head of your bed raised. Use a wedge under the mattress or blocks under the bed frame to raise the head of the bed. What foods are not recommended? The items listed may not be a complete list. Talk with your dietitian about what dietary choices are best for you. Grains Pastries or quick breads with added fat. French toast. Vegetables Deep fried vegetables. French fries. Any vegetables prepared with added fat. Any vegetables that cause symptoms. For some people this may include tomatoes and tomato products, chili peppers, onions and garlic, and horseradish. Fruits Any fruits prepared with added fat. Any fruits that cause symptoms. For some people this may include citrus fruits, such as   oranges, grapefruit, pineapple, and lemons. Meats and other protein foods High-fat meats, such as fatty beef or pork, hot dogs, ribs, ham, sausage, salami and bacon. Fried meat or protein, including fried fish and fried chicken. Nuts and nut butters. Dairy Whole milk and chocolate milk. Sour cream. Cream. Ice cream. Cream cheese. Milk shakes. Beverages Coffee and tea, with or without caffeine. Carbonated beverages.  Sodas. Energy drinks. Fruit juice made with acidic fruits (such as orange or grapefruit). Tomato juice. Alcoholic drinks. Fats and oils Butter. Margarine. Shortening. Ghee. Sweets and desserts Chocolate and cocoa. Donuts. Seasoning and other foods Pepper. Peppermint and spearmint. Any condiments, herbs, or seasonings that cause symptoms. For some people, this may include curry, hot sauce, or vinegar-based salad dressings. Summary  When you have gastroesophageal reflux disease (GERD), food and lifestyle choices are very important to help ease the discomfort of GERD.  Eat frequent, small meals instead of three large meals each day. Eat your meals slowly, in a relaxed setting. Avoid bending over or lying down until 2-3 hours after eating.  Limit high-fat foods such as fatty meat or fried foods. This information is not intended to replace advice given to you by your health care provider. Make sure you discuss any questions you have with your health care provider. Document Revised: 01/25/2019 Document Reviewed: 10/05/2016 Elsevier Patient Education  2020 [employer]  
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
## 2019-11-12 ENCOUNTER — Other Ambulatory Visit: Payer: Self-pay

## 2019-11-12 ENCOUNTER — Ambulatory Visit: Payer: BC Managed Care – PPO | Admitting: Registered Nurse

## 2019-11-12 ENCOUNTER — Encounter: Payer: Self-pay | Admitting: Registered Nurse

## 2019-11-12 VITALS — BP 168/102 | HR 75 | Temp 98.2°F | Resp 16 | Ht 72.0 in | Wt 170.2 lb

## 2019-11-12 DIAGNOSIS — R202 Paresthesia of skin: Secondary | ICD-10-CM | POA: Diagnosis not present

## 2019-11-12 DIAGNOSIS — I10 Essential (primary) hypertension: Secondary | ICD-10-CM | POA: Diagnosis not present

## 2019-11-12 DIAGNOSIS — R002 Palpitations: Secondary | ICD-10-CM

## 2019-11-12 DIAGNOSIS — Z716 Tobacco abuse counseling: Secondary | ICD-10-CM

## 2019-11-12 DIAGNOSIS — M79602 Pain in left arm: Secondary | ICD-10-CM

## 2019-11-12 DIAGNOSIS — Z13220 Encounter for screening for lipoid disorders: Secondary | ICD-10-CM

## 2019-11-12 DIAGNOSIS — M79601 Pain in right arm: Secondary | ICD-10-CM | POA: Diagnosis not present

## 2019-11-12 MED ORDER — CHANTIX STARTING MONTH PAK 0.5 MG X 11 & 1 MG X 42 PO TABS: ORAL_TABLET | ORAL | 0 refills | Status: DC

## 2019-11-12 MED ORDER — AMLODIPINE BESYLATE 5 MG PO TABS: 5.0000 mg | ORAL_TABLET | Freq: Every day | ORAL | 0 refills | Status: DC

## 2019-11-12 MED ORDER — VARENICLINE TARTRATE 1 MG PO TABS: 1.0000 mg | ORAL_TABLET | Freq: Two times a day (BID) | ORAL | 1 refills | Status: DC

## 2019-11-12 NOTE — Progress Notes (Signed)
Established Patient Office Visit  Subjective:  Patient ID: Samuel Kim, male    DOB: [birth year]  Age: 49 y.o. MRN: [id number]  CC:  Chief Complaint  Patient presents with  . Hypertension    patient states he have been changing his eating and all and this past week blood pressure been very high and making him feel weird. Per patient feel  some tingling in his fingers .    HPI Samuel Kim presents for BP concerns  Notes that his BP has been high in the past, but he has been resistant to taking medications. Admits that his lifestyle has been unhelpful to his BP - frequent fast foods, red meat, alcohol, and tobacco. Has cut down on all of these over the previous weeks, interested in quitting smoking. Notes that on Friday he was bent over at work, and when rising suddenly, he felt extremely lightheaded and dizzy. Had to step away for a few moments before he felt back to normal. Over weekend, had an episode of heart palpitations that lasted 20-30 minutes. Admits that anxiety may have played a role in this.  BP very elevated today, but he is asymptomatic, denying headache, visual changes, shob, doe, chest pain, dependent edema.  Has recently been treated for GERD and MSK injury of chest wall - believes injury is from playing with his 70lb dog.  No past medical history on file.  No past surgical history on file.  No family history on file.  Social History   Socioeconomic History  . Marital status: Single    Spouse name: Not on file  . Number of children: Not on file  . Years of education: Not on file  . Highest education level: Not on file  Occupational History  . Not on file  Tobacco Use  . Smoking status: Current Every Day Smoker  . Smokeless tobacco: Never Used  Substance and Sexual Activity  . Alcohol use: Yes    Alcohol/week: 0.0 standard drinks  . Drug use: Not Currently  . Sexual activity: Not on file  Other Topics Concern  . Not on file  Social History Narrative    . Not on file   Social Determinants of Health   Financial Resource Strain:   . Difficulty of Paying Living Expenses: Not on file  Food Insecurity:   . Worried About Charity fundraiser in the Last Year: Not on file  . Ran Out of Food in the Last Year: Not on file  Transportation Needs:   . Lack of Transportation (Medical): Not on file  . Lack of Transportation (Non-Medical): Not on file  Physical Activity:   . Days of Exercise per Week: Not on file  . Minutes of Exercise per Session: Not on file  Stress:   . Feeling of Stress : Not on file  Social Connections:   . Frequency of Communication with Friends and Family: Not on file  . Frequency of Social Gatherings with Friends and Family: Not on file  . Attends Religious Services: Not on file  . Active Member of Clubs or Organizations: Not on file  . Attends Archivist Meetings: Not on file  . Marital Status: Not on file  Intimate Partner Violence:   . Fear of Current or Ex-Partner: Not on file  . Emotionally Abused: Not on file  . Physically Abused: Not on file  . Sexually Abused: Not on file    Outpatient Medications Prior to Visit  Medication Sig Dispense Refill  .  omeprazole (PRILOSEC) 20 MG capsule Take 1 capsule (20 mg total) by mouth daily. 30 capsule 1   No facility-administered medications prior to visit.    No Known Allergies  ROS Review of Systems Per hpi    Objective:    Physical Exam  Constitutional: He is oriented to person, place, and time. He appears well-developed and well-nourished. No distress.  Cardiovascular: Normal rate and regular rhythm.  Pulmonary/Chest: Effort normal. No respiratory distress.  Neurological: He is alert and oriented to person, place, and time.  Skin: Skin is warm and dry. No rash noted. He is not diaphoretic. No erythema. No pallor.  Psychiatric: He has a normal mood and affect. His behavior is normal. Judgment and thought content normal.  Nursing note and vitals  reviewed.   BP (!) 168/102   Pulse 75   Temp 98.2 F (36.8 C) (Temporal)   Resp 16   Ht 6' (1.829 m)   Wt 170 lb 3.2 oz (77.2 kg)   SpO2 98%   BMI 23.08 kg/m  Wt Readings from Last 3 Encounters:  11/12/19 170 lb 3.2 oz (77.2 kg)  10/26/19 173 lb 9.6 oz (78.7 kg)  04/09/19 182 lb 6.4 oz (82.7 kg)     Health Maintenance Due  Topic Date Due  . HIV Screening  03/03/1987    There are no preventive care reminders to display for this patient.  No results found for: TSH Lab Results  Component Value Date   WBC 7.5 10/22/2019   HGB 17.3 (H) 10/22/2019   HCT 49.7 10/22/2019   MCV 99.6 10/22/2019   PLT 153 10/22/2019   Lab Results  Component Value Date   NA 136 10/22/2019   K 3.4 (L) 10/22/2019   CO2 24 10/22/2019   GLUCOSE 106 (H) 10/22/2019   BUN 6 10/22/2019   CREATININE 0.88 10/22/2019   BILITOT 0.7 04/09/2019   ALKPHOS 171 (H) 04/09/2019   AST 21 04/09/2019   ALT 28 04/09/2019   PROT 6.5 04/09/2019   ALBUMIN 4.2 04/09/2019   CALCIUM 9.3 10/22/2019   ANIONGAP 12 10/22/2019   Lab Results  Component Value Date   CHOL 158 04/09/2019   Lab Results  Component Value Date   HDL 24 (L) 04/09/2019   Lab Results  Component Value Date   LDLCALC 57 04/09/2019   Lab Results  Component Value Date   TRIG 386 (H) 04/09/2019   Lab Results  Component Value Date   CHOLHDL 6.6 (H) 04/09/2019   No results found for: HGBA1C    Assessment & Plan:   Problem List Items Addressed This Visit    None    Visit Diagnoses    Palpitations    -  Primary   Relevant Orders   EKG 12-Lead (Completed)   CMP14+EGFR   Hemoglobin A1c   Lipid screening       Relevant Orders   Lipid panel   Paresthesia and pain of both upper extremities       Relevant Orders   CMP14+EGFR   Vitamin D, 25-hydroxy   Vitamin B12   Essential hypertension       Relevant Medications   amLODipine (NORVASC) 5 MG tablet      Meds ordered this encounter  Medications  . amLODipine (NORVASC) 5  MG tablet    Sig: Take 1 tablet (5 mg total) by mouth daily.    Dispense:  60 tablet    Refill:  0    Order Specific Question:  Supervising Provider    Answer:   Forrest Moron [[id number]]    Follow-up: Return in about 2 weeks (around 11/26/2019) for Nurse only BP check - send to provider.   PLAN  Normal EKG. Normal findings on exam  Will start amlodipine 44m PO qd with nurse visit bp check in 2 weeks  Labs drawn, will follow up as warranted  Discussed smoking cessation. Will give Chantix if he desires, but he will try to quit on his own for now  Discussed lifestyle effects on HTN  Patient encouraged to call clinic with any questions, comments, or concerns.   RMaximiano Coss NP

## 2019-11-12 NOTE — Patient Instructions (Addendum)
[name]-  I'd like to see you back in 2 weeks for a nurse visit - BP check. They'll relay the results to me and at that time, if your blood pressure is still elevated, we will double your dose of amlodipine to 10mg  each day. It likely will still be above our goal, and that is okay. We want to bring it down in a controlled manor for a few reasons - including not wanting to go too low and wanting to make sure you're at minimal risk for adverse effects. After doubling the amlodipine to 10mg , we will talk about adding another medication.  I've attached some reading on blood pressure here, but to put it simply, the best things outside of medication for controlling blood pressure are: heart healthy diet, regular mild to moderate exercise, quitting smoking, and limiting drinking. As far as diet, I recommend just about the same thing to everyone: limit processed foods, control portion sizes, and eat mostly plants and vegetables. While it's okay to have desserts, fried foods, and red meats, making sure these are limited in frequency and amount is key. Steak a couple of times a month is fine - steak every day is not ideal.   If you have lab work done today you will be contacted with your lab results within the next 2 weeks.  If you have not heard from [place] then please contact us. The fastest way to get your results is to register for My Chart.   IF you received an x-ray today, you will receive an invoice from [facility]. Please contact [facility] at [phone] with questions or concerns regarding your invoice.   IF you received labwork today, you will receive an invoice from [facility]. Please contact [facility] at [phone] with questions or concerns regarding your invoice.   Our billing staff will not be able to assist you with questions regarding bills from these companies.  You will be contacted with the lab results as soon as they are available. The fastest way to get your results is  to activate your My Chart account. Instructions are located on the last page of this paperwork. If you have not heard from [place] regarding the results in 2 weeks, please contact this office.     Palpitations Palpitations are feelings that your heartbeat is irregular or is faster than normal. It may feel like your heart is fluttering or skipping a beat. Palpitations are usually not a serious problem. They may be caused by many things, including smoking, caffeine, alcohol, stress, and certain medicines or drugs. Most causes of palpitations are not serious. However, some palpitations can be a sign of a serious problem. You may need further tests to rule out serious medical problems. Follow these instructions at home:     Pay attention to any changes in your condition. Take these actions to help manage your symptoms: Eating and drinking  Avoid foods and drinks that may cause palpitations. These may include: ? Caffeinated coffee, tea, soft drinks, diet pills, and energy drinks. ? Chocolate. ? Alcohol. Lifestyle  Take steps to reduce your stress and anxiety. Things that can help you relax include: ? Yoga. ? Mind-body activities, such as deep breathing, meditation, or using words and images to create positive thoughts (guided imagery). ? Physical activity, such as swimming, jogging, or walking. Tell your health care provider if your palpitations increase with activity. If you have chest pain or shortness of breath with activity, do not continue the activity until you are  seen by your health care provider. ? Biofeedback. This is a method that helps you learn to use your mind to control things in your body, such as your heartbeat.  Do not use drugs, including cocaine or ecstasy. Do not use marijuana.  Get plenty of rest and sleep. Keep a regular bed time. General instructions  Take over-the-counter and prescription medicines only as told by your health care provider.  Do not use any products that  contain nicotine or tobacco, such as cigarettes and e-cigarettes. If you need help quitting, ask your health care provider.  Keep all follow-up visits as told by your health care provider. This is important. These may include visits for further testing if palpitations do not go away or get worse. Contact a health care provider if you:  Continue to have a fast or irregular heartbeat after 24 hours.  Notice that your palpitations occur more often. Get help right away if you:  Have chest pain or shortness of breath.  Have a severe headache.  Feel dizzy or you faint. Summary  Palpitations are feelings that your heartbeat is irregular or is faster than normal. It may feel like your heart is fluttering or skipping a beat.  Palpitations may be caused by many things, including smoking, caffeine, alcohol, stress, certain medicines, and drugs.  Although most causes of palpitations are not serious, some causes can be a sign of a serious medical problem.  Get help right away if you faint or have chest pain, shortness of breath, a severe headache, or dizziness. This information is not intended to replace advice given to you by your health care provider. Make sure you discuss any questions you have with your health care provider. Document Revised: 11/16/2017 Document Reviewed: 11/16/2017 Elsevier Patient Education  [place].  Paresthesia Paresthesia is an abnormal burning or prickling sensation. It is usually felt in the hands, arms, legs, or feet. However, it may occur in any part of the body. Usually, paresthesia is not painful. It may feel like:  Tingling or numbness.  Buzzing.  Itching. Paresthesia may occur without any clear cause, or it may be caused by:  Breathing too quickly (hyperventilation).  Pressure on a nerve.  An underlying medical condition.  Side effects of a medication.  Nutritional deficiencies.  Exposure to toxic chemicals. Most people experience  temporary (transient) paresthesia at some time in their lives. For some people, it may be long-lasting (chronic) because of an underlying medical condition. If you have paresthesia that lasts a long time, you may need to be evaluated by your health care provider. Follow these instructions at home: Alcohol use   Do not drink alcohol if: ? Your health care provider tells you not to drink. ? You are pregnant, may be pregnant, or are planning to become pregnant.  If you drink alcohol: ? Limit how much you use to:  0-1 drink a day for women.  0-2 drinks a day for men. ? Be aware of how much alcohol is in your drink. In the [place] one drink equals one 12 oz bottle of beer (355 mL), one 5 oz glass of wine (148 mL), or one 1 oz glass of hard liquor (44 mL). Nutrition   Eat a healthy diet. This includes: ? Eating foods that are high in fiber, such as fresh fruits and vegetables, whole grains, and beans. ? Limiting foods that are high in fat and processed sugars, such as fried or sweet foods. General instructions  Take over-the-counter and  prescription medicines only as told by your health care provider.  Do not use any products that contain nicotine or tobacco, such as cigarettes and e-cigarettes. These can keep blood from reaching damaged nerves. If you need help quitting, ask your health care provider.  If you have diabetes, work closely with your health care provider to keep your blood sugar under control.  If you have numbness in your feet: ? Check every day for signs of injury or infection. Watch for redness, warmth, and swelling. ? Wear padded socks and comfortable shoes. These help protect your feet.  Keep all follow-up visits as told by your health care provider. This is important. Contact a health care provider if you:  Have paresthesia that gets worse or does not go away.  Have a burning or prickling feeling that gets worse when you walk.  Have pain, cramps, or  dizziness.  Develop a rash. Get help right away if you:  Feel weak.  Have trouble walking or moving.  Have problems with speech, understanding, or vision.  Feel confused.  Cannot control your bladder or bowel movements.  Have numbness after an injury.  Develop new weakness in an arm or leg.  Faint. Summary  Paresthesia is an abnormal burning or prickling sensation that is usually felt in the hands, arms, legs, or feet. It may also occur in other parts of the body.  Paresthesia may occur without any clear cause, or it may be caused by breathing too quickly (hyperventilation), pressure on a nerve, an underlying medical condition, side effects of a medication, nutritional deficiencies, or exposure to toxic chemicals.  If you have paresthesia that lasts a long time, you may need to be evaluated by your health care provider. This information is not intended to replace advice given to you by your health care provider. Make sure you discuss any questions you have with your health care provider. Document Revised: 10/30/2018 Document Reviewed: 10/13/2017 Elsevier Patient Education  2020 Elsevier Inc.  Preventing Hypertension Hypertension, commonly called high blood pressure, is when the force of blood pumping through the arteries is too strong. Arteries are blood vessels that carry blood from the heart throughout the body. Over time, hypertension can damage the arteries and decrease blood flow to important parts of the body, including the brain, heart, and kidneys. Often, hypertension does not cause symptoms until blood pressure is very high. For this reason, it is important to have your blood pressure checked on a regular basis. Hypertension can often be prevented with diet and lifestyle changes. If you already have hypertension, you can control it with diet and lifestyle changes, as well as medicine. What nutrition changes can be made? Maintain a healthy diet. This includes:  Eating  less salt (sodium). Ask your health care provider how much sodium is safe for you to have. The general recommendation is to consume less than 1 tsp (2,300 mg) of sodium a day. ? Do not add salt to your food. ? Choose low-sodium options when grocery shopping and eating out.  Limiting fats in your diet. You can do this by eating low-fat or fat-free dairy products and by eating less red meat.  Eating more fruits, vegetables, and whole grains. Make a goal to eat: ? 1-2 cups of fresh fruits and vegetables each day. ? 3-4 servings of whole grains each day.  Avoiding foods and beverages that have added sugars.  Eating fish that contain healthy fats (omega-3 fatty acids), such as mackerel or salmon. If you need  help putting together a healthy eating plan, try the DASH diet. This diet is high in fruits, vegetables, and whole grains. It is low in sodium, red meat, and added sugars. DASH stands for Dietary Approaches to Stop Hypertension. What lifestyle changes can be made?   Lose weight if you are overweight. Losing just 3?5% of your body weight can help prevent or control hypertension. ? For example, if your present weight is 200 lb (91 kg), a loss of 3-5% of your weight means losing 6-10 lb (2.7-4.5 kg). ? Ask your health care provider to help you with a diet and exercise plan to safely lose weight.  Get enough exercise. Do at least 150 minutes of moderate-intensity exercise each week. ? You could do this in short exercise sessions several times a day, or you could do longer exercise sessions a few times a week. For example, you could take a brisk 10-minute walk or bike ride, 3 times a day, for 5 days a week.  Find ways to reduce stress, such as exercising, meditating, listening to music, or taking a yoga class. If you need help reducing stress, ask your health care provider.  Do not smoke. This includes e-cigarettes. Chemicals in tobacco and nicotine products raise your blood pressure each time  you smoke. If you need help quitting, ask your health care provider.  Avoid alcohol. If you drink alcohol, limit alcohol intake to no more than 1 drink a day for nonpregnant women and 2 drinks a day for men. One drink equals 12 oz of beer, 5 oz of wine, or 1 oz of hard liquor. Why are these changes important? Diet and lifestyle changes can help you prevent hypertension, and they may make you feel better overall and improve your quality of life. If you have hypertension, making these changes will help you control it and help prevent major complications, such as:  Hardening and narrowing of arteries that supply blood to: ? Your heart. This can cause a heart attack. ? Your brain. This can cause a stroke. ? Your kidneys. This can cause kidney failure.  Stress on your heart muscle, which can cause heart failure. What can I do to lower my risk?  Work with your health care provider to make a hypertension prevention plan that works for you. Follow your plan and keep all follow-up visits as told by your health care provider.  Learn how to check your blood pressure at home. Make sure that you know your personal target blood pressure, as told by your health care provider. How is this treated? In addition to diet and lifestyle changes, your health care provider may recommend medicines to help lower your blood pressure. You may need to try a few different medicines to find what works best for you. You also may need to take more than one medicine. Take over-the-counter and prescription medicines only as told by your health care provider. Where to find support Your health care provider can help you prevent hypertension and help you keep your blood pressure at a healthy level. Your local hospital or your community may also provide support services and prevention programs. The American Heart Association offers an online support network at: https://www.lee.net/ Where to find more  information Learn more about hypertension from:  National Heart, Lung, and Blood Institute: https://www.peterson.org/  Centers for Disease Control and Prevention: AboutHD.co.nz  American Academy of Family Physicians: http://familydoctor.org/familydoctor/en/diseases-conditions/high-blood-pressure.printerview.all.html Learn more about the DASH diet from:  National Heart, Lung, and Blood Institute: WedMap.it Contact a health  care provider if:  You think you are having a reaction to medicines you have taken.  You have recurrent headaches or feel dizzy.  You have swelling in your ankles.  You have trouble with your vision. Summary  Hypertension often does not cause any symptoms until blood pressure is very high. It is important to get your blood pressure checked regularly.  Diet and lifestyle changes are the most important steps in preventing hypertension.  By keeping your blood pressure in a healthy range, you can prevent complications like heart attack, heart failure, stroke, and kidney failure.  Work with your health care provider to make a hypertension prevention plan that works for you. This information is not intended to replace advice given to you by your health care provider. Make sure you discuss any questions you have with your health care provider. Document Revised: 01/26/2019 Document Reviewed: 06/14/2016 Elsevier Patient Education  2020 [employer] Varenicline oral tablets What is this medicine? VARENICLINE (var e NI kleen) is used to help people quit smoking. It is used with a patient support program recommended by your physician. This medicine may be used for other purposes; ask your health care provider or pharmacist if you have questions. COMMON BRAND NAME(S): Chantix What should I tell my health care provider before I take this medicine? They need to know if you have any of these  conditions:  heart disease  if you often drink alcohol  kidney disease  mental illness  on hemodialysis  seizures  history of stroke  suicidal thoughts, plans, or attempt; a previous suicide attempt by you or a family member  an unusual or allergic reaction to varenicline, other medicines, foods, dyes, or preservatives  pregnant or trying to get pregnant  breast-feeding How should I use this medicine? Take this medicine by mouth after eating. Take with a full glass of water. Follow the directions on the prescription label. Take your doses at regular intervals. Do not take your medicine more often than directed. There are 3 ways you can use this medicine to help you quit smoking; talk to your health care professional to decide which plan is right for you: 1) you can choose a quit date and start this medicine 1 week before the quit date, or, 2) you can start taking this medicine before you choose a quit date, and then pick a quit date between day 8 and 35 days of treatment, or, 3) if you are not sure that you are able or willing to quit smoking right away, start taking this medicine and slowly decrease the amount you smoke as directed by your health care professional with the goal of being cigarette-free by week 12 of treatment. Stick to your plan; ask about support groups or other ways to help you remain cigarette-free. If you are motivated to quit smoking and did not succeed during a previous attempt with this medicine for reasons other than side effects, or if you returned to smoking after this treatment, speak with your health care professional about whether another course of this medicine may be right for you. A special MedGuide will be given to you by the pharmacist with each prescription and refill. Be sure to read this information carefully each time. Talk to your pediatrician regarding the use of this medicine in children. This medicine is not approved for use in  children. Overdosage: If you think you have taken too much of this medicine contact a poison control center or emergency room at once. NOTE: This  medicine is only for you. Do not share this medicine with others. What if I miss a dose? If you miss a dose, take it as soon as you can. If it is almost time for your next dose, take only that dose. Do not take double or extra doses. What may interact with this medicine?  alcohol  insulin  other medicines used to help people quit smoking  theophylline  warfarin This list may not describe all possible interactions. Give your health care provider a list of all the medicines, herbs, non-prescription drugs, or dietary supplements you use. Also tell them if you smoke, drink alcohol, or use illegal drugs. Some items may interact with your medicine. What should I watch for while using this medicine? It is okay if you do not succeed at your attempt to quit and have a cigarette. You can still continue your quit attempt and keep using this medicine as directed. Just throw away your cigarettes and get back to your quit plan. Talk to your health care provider before using other treatments to quit smoking. Using this medicine with other treatments to quit smoking may increase the risk for side effects compared to using a treatment alone. You may get drowsy or dizzy. Do not drive, use machinery, or do anything that needs mental alertness until you know how this medicine affects you. Do not stand or sit up quickly, especially if you are an older patient. This reduces the risk of dizzy or fainting spells. Decrease the number of alcoholic beverages that you drink during treatment with this medicine until you know if this medicine affects your ability to tolerate alcohol. Some people have experienced increased drunkenness (intoxication), unusual or sometimes aggressive behavior, or no memory of things that have happened (amnesia) during treatment with this  medicine. Sleepwalking can happen during treatment with this medicine, and can sometimes lead to behavior that is harmful to you, other people, or property. Stop taking this medicine and tell your doctor if you start sleepwalking or have other unusual sleep-related activity. After taking this medicine, you may get up out of bed and do an activity that you do not know you are doing. The next morning, you may have no memory of this. Activities include driving a car ("sleep-driving"), making and eating food, talking on the phone, sexual activity, and sleep-walking. Serious injuries have occurred. Stop the medicine and call your doctor right away if you find out you have done any of these activities. Do not take this medicine if you have used alcohol that evening. Do not take it if you have taken another medicine for sleep. The risk of doing these sleep-related activities is higher. Patients and their families should watch out for new or worsening depression or thoughts of suicide. Also watch out for sudden changes in feelings such as feeling anxious, agitated, panicky, irritable, hostile, aggressive, impulsive, severely restless, overly excited and hyperactive, or not being able to sleep. If this happens, call your health care professional. If you have diabetes and you quit smoking, the effects of insulin may be increased and you may need to reduce your insulin dose. Check with your doctor or health care professional about how you should adjust your insulin dose. What side effects may I notice from receiving this medicine? Side effects that you should report to your doctor or health care professional as soon as possible:  allergic reactions like skin rash, itching or hives, swelling of the face, lips, tongue, or throat  acting aggressive, being angry  or violent, or acting on dangerous impulses  breathing problems  changes in emotions or moods  chest pain or chest tightness  feeling faint or  lightheaded, falls  hallucination, loss of contact with reality  mouth sores  redness, blistering, peeling or loosening of the skin, including inside the mouth  signs and symptoms of a stroke like changes in vision; confusion; trouble speaking or understanding; severe headaches; sudden numbness or weakness of the face, arm or leg; trouble walking; dizziness; loss of balance or coordination  seizures  sleepwalking  suicidal thoughts or other mood changes Side effects that usually do not require medical attention (report to your doctor or health care professional if they continue or are bothersome):  constipation  gas  headache  nausea, vomiting  strange dreams  trouble sleeping This list may not describe all possible side effects. Call your doctor for medical advice about side effects. You may report side effects to FDA at 1-800-FDA-1088. Where should I keep my medicine? Keep out of the reach of children. Store at room temperature between 15 and 30 degrees C (59 and 86 degrees F). Throw away any unused medicine after the expiration date. NOTE: This sheet is a summary. It may not cover all possible information. If you have questions about this medicine, talk to your doctor, pharmacist, or health care provider.  2020 Elsevier/Gold Standard (2018-09-22 14:27:36)  Hypertension, Adult High blood pressure (hypertension) is when the force of blood pumping through the arteries is too strong. The arteries are the blood vessels that carry blood from the heart throughout the body. Hypertension forces the heart to work harder to pump blood and may cause arteries to become narrow or stiff. Untreated or uncontrolled hypertension can cause a heart attack, heart failure, a stroke, kidney disease, and other problems. A blood pressure reading consists of a higher number over a lower number. Ideally, your blood pressure should be below 120/80. The first ("top") number is called the systolic  pressure. It is a measure of the pressure in your arteries as your heart beats. The second ("bottom") number is called the diastolic pressure. It is a measure of the pressure in your arteries as the heart relaxes. What are the causes? The exact cause of this condition is not known. There are some conditions that result in or are related to high blood pressure. What increases the risk? Some risk factors for high blood pressure are under your control. The following factors may make you more likely to develop this condition:  Smoking.  Having type 2 diabetes mellitus, high cholesterol, or both.  Not getting enough exercise or physical activity.  Being overweight.  Having too much fat, sugar, calories, or salt (sodium) in your diet.  Drinking too much alcohol. Some risk factors for high blood pressure may be difficult or impossible to change. Some of these factors include:  Having chronic kidney disease.  Having a family history of high blood pressure.  Age. Risk increases with age.  Race. You may be at higher risk if you are African [place].  Gender. Men are at higher risk than women before age 52. After age 29, women are at higher risk than men.  Having obstructive sleep apnea.  Stress. What are the signs or symptoms? High blood pressure may not cause symptoms. Very high blood pressure (hypertensive crisis) may cause:  Headache.  Anxiety.  Shortness of breath.  Nosebleed.  Nausea and vomiting.  Vision changes.  Severe chest pain.  Seizures. How is this diagnosed?  This condition is diagnosed by measuring your blood pressure while you are seated, with your arm resting on a flat surface, your legs uncrossed, and your feet flat on the floor. The cuff of the blood pressure monitor will be placed directly against the skin of your upper arm at the level of your heart. It should be measured at least twice using the same arm. Certain conditions can cause a difference in blood  pressure between your right and left arms. Certain factors can cause blood pressure readings to be lower or higher than normal for a short period of time:  When your blood pressure is higher when you are in a health care provider's office than when you are at home, this is called white coat hypertension. Most people with this condition do not need medicines.  When your blood pressure is higher at home than when you are in a health care provider's office, this is called masked hypertension. Most people with this condition may need medicines to control blood pressure. If you have a high blood pressure reading during one visit or you have normal blood pressure with other risk factors, you may be asked to:  Return on a different day to have your blood pressure checked again.  Monitor your blood pressure at home for 1 week or longer. If you are diagnosed with hypertension, you may have other blood or imaging tests to help your health care provider understand your overall risk for other conditions. How is this treated? This condition is treated by making healthy lifestyle changes, such as eating healthy foods, exercising more, and reducing your alcohol intake. Your health care provider may prescribe medicine if lifestyle changes are not enough to get your blood pressure under control, and if:  Your systolic blood pressure is above 130.  Your diastolic blood pressure is above 80. Your personal target blood pressure may vary depending on your medical conditions, your age, and other factors. Follow these instructions at home: Eating and drinking   Eat a diet that is high in fiber and potassium, and low in sodium, added sugar, and fat. An example eating plan is called the DASH (Dietary Approaches to Stop Hypertension) diet. To eat this way: ? Eat plenty of fresh fruits and vegetables. Try to fill one half of your plate at each meal with fruits and vegetables. ? Eat whole grains, such as whole-wheat  pasta, brown rice, or whole-grain bread. Fill about one fourth of your plate with whole grains. ? Eat or drink low-fat dairy products, such as skim milk or low-fat yogurt. ? Avoid fatty cuts of meat, processed or cured meats, and poultry with skin. Fill about one fourth of your plate with lean proteins, such as fish, chicken without skin, beans, eggs, or tofu. ? Avoid pre-made and processed foods. These tend to be higher in sodium, added sugar, and fat.  Reduce your daily sodium intake. Most people with hypertension should eat less than 1,500 mg of sodium a day.  Do not drink alcohol if: ? Your health care provider tells you not to drink. ? You are pregnant, may be pregnant, or are planning to become pregnant.  If you drink alcohol: ? Limit how much you use to:  0-1 drink a day for women.  0-2 drinks a day for men. ? Be aware of how much alcohol is in your drink. In the [place] one drink equals one 12 oz bottle of beer (355 mL), one 5 oz glass of wine (148 mL), or  one 1 oz glass of hard liquor (44 mL). Lifestyle   Work with your health care provider to maintain a healthy body weight or to lose weight. Ask what an ideal weight is for you.  Get at least 30 minutes of exercise most days of the week. Activities may include walking, swimming, or biking.  Include exercise to strengthen your muscles (resistance exercise), such as Pilates or lifting weights, as part of your weekly exercise routine. Try to do these types of exercises for 30 minutes at least 3 days a week.  Do not use any products that contain nicotine or tobacco, such as cigarettes, e-cigarettes, and chewing tobacco. If you need help quitting, ask your health care provider.  Monitor your blood pressure at home as told by your health care provider.  Keep all follow-up visits as told by your health care provider. This is important. Medicines  Take over-the-counter and prescription medicines only as told by your health care  provider. Follow directions carefully. Blood pressure medicines must be taken as prescribed.  Do not skip doses of blood pressure medicine. Doing this puts you at risk for problems and can make the medicine less effective.  Ask your health care provider about side effects or reactions to medicines that you should watch for. Contact a health care provider if you:  Think you are having a reaction to a medicine you are taking.  Have headaches that keep coming back (recurring).  Feel dizzy.  Have swelling in your ankles.  Have trouble with your vision. Get help right away if you:  Develop a severe headache or confusion.  Have unusual weakness or numbness.  Feel faint.  Have severe pain in your chest or abdomen.  Vomit repeatedly.  Have trouble breathing. Summary  Hypertension is when the force of blood pumping through your arteries is too strong. If this condition is not controlled, it may put you at risk for serious complications.  Your personal target blood pressure may vary depending on your medical conditions, your age, and other factors. For most people, a normal blood pressure is less than 120/80.  Hypertension is treated with lifestyle changes, medicines, or a combination of both. Lifestyle changes include losing weight, eating a healthy, low-sodium diet, exercising more, and limiting alcohol. This information is not intended to replace advice given to you by your health care provider. Make sure you discuss any questions you have with your health care provider. Document Revised: 06/14/2018 Document Reviewed: 06/14/2018 Elsevier Patient Education  2020 [employer]

## 2019-11-13 LAB — CMP14+EGFR
ALT: 9 IU/L (ref 0–44)
AST: 19 IU/L (ref 0–40)
Albumin/Globulin Ratio: 1.8 (ref 1.2–2.2)
Albumin: 4.6 g/dL (ref 4.0–5.0)
Alkaline Phosphatase: 113 IU/L (ref 39–117)
BUN/Creatinine Ratio: 8 — ABNORMAL LOW (ref 9–20)
BUN: 8 mg/dL (ref 6–24)
Bilirubin Total: 0.5 mg/dL (ref 0.0–1.2)
CO2: 22 mmol/L (ref 20–29)
Calcium: 9.6 mg/dL (ref 8.7–10.2)
Chloride: 98 mmol/L (ref 96–106)
Creatinine, Ser: 0.95 mg/dL (ref 0.76–1.27)
GFR calc Af Amer: 110 mL/min/{1.73_m2} (ref >59)
GFR calc non Af Amer: 95 mL/min/{1.73_m2} (ref >59)
Globulin, Total: 2.5 g/dL (ref 1.5–4.5)
Glucose: 103 mg/dL — ABNORMAL HIGH (ref 65–99)
Potassium: 3.9 mmol/L (ref 3.5–5.2)
Sodium: 136 mmol/L (ref 134–144)
Total Protein: 7.1 g/dL (ref 6.0–8.5)

## 2019-11-13 LAB — VITAMIN D 25 HYDROXY (VIT D DEFICIENCY, FRACTURES): Vit D, 25-Hydroxy: 13.9 ng/mL — ABNORMAL LOW (ref 30.0–100.0)

## 2019-11-13 LAB — LIPID PANEL
Chol/HDL Ratio: 4.2 ratio (ref 0.0–5.0)
Cholesterol, Total: 173 mg/dL (ref 100–199)
HDL: 41 mg/dL (ref >39)
LDL Chol Calc (NIH): 101 mg/dL — ABNORMAL HIGH (ref 0–99)
Triglycerides: 175 mg/dL — ABNORMAL HIGH (ref 0–149)
VLDL Cholesterol Cal: 31 mg/dL (ref 5–40)

## 2019-11-13 LAB — HEMOGLOBIN A1C
Est. average glucose Bld gHb Est-mCnc: 105 mg/dL
Hgb A1c MFr Bld: 5.3 % (ref 4.8–5.6)

## 2019-11-13 LAB — VITAMIN B12: Vitamin B-12: 168 pg/mL — ABNORMAL LOW (ref 232–1245)

## 2019-11-14 ENCOUNTER — Encounter: Payer: Self-pay | Admitting: Registered Nurse

## 2019-11-14 DIAGNOSIS — E559 Vitamin D deficiency, unspecified: Secondary | ICD-10-CM

## 2019-11-14 MED ORDER — VITAMIN D (ERGOCALCIFEROL) 1.25 MG (50000 UNIT) PO CAPS: 50000.0000 [IU] | ORAL_CAPSULE | ORAL | 0 refills | Status: DC

## 2019-11-26 ENCOUNTER — Ambulatory Visit (INDEPENDENT_AMBULATORY_CARE_PROVIDER_SITE_OTHER): Payer: BC Managed Care – PPO | Admitting: Family Medicine

## 2019-11-26 ENCOUNTER — Telehealth (INDEPENDENT_AMBULATORY_CARE_PROVIDER_SITE_OTHER): Payer: BC Managed Care – PPO | Admitting: Emergency Medicine

## 2019-11-26 ENCOUNTER — Other Ambulatory Visit: Payer: Self-pay | Admitting: Emergency Medicine

## 2019-11-26 ENCOUNTER — Other Ambulatory Visit: Payer: Self-pay

## 2019-11-26 VITALS — BP 138/86

## 2019-11-26 DIAGNOSIS — I10 Essential (primary) hypertension: Secondary | ICD-10-CM

## 2019-11-26 DIAGNOSIS — E559 Vitamin D deficiency, unspecified: Secondary | ICD-10-CM

## 2019-11-26 NOTE — Telephone Encounter (Signed)
Excellent, he can stay on this dose going forward, I am satisfied with that BP   Thank you  Jari Sportsman, NP

## 2019-11-26 NOTE — Telephone Encounter (Signed)
Patient was informed of msg below

## 2019-11-26 NOTE — Telephone Encounter (Signed)
Nurse visit only for bp recheck which was 138/86 and patient is taking 10 mg of the amlodipine

## 2019-12-10 ENCOUNTER — Telehealth: Payer: Self-pay | Admitting: Family Medicine

## 2019-12-10 ENCOUNTER — Telehealth: Payer: Self-pay | Admitting: Registered Nurse

## 2019-12-10 DIAGNOSIS — I10 Essential (primary) hypertension: Secondary | ICD-10-CM

## 2019-12-10 MED ORDER — AMLODIPINE BESYLATE 5 MG PO TABS: 5.0000 mg | ORAL_TABLET | Freq: Every day | ORAL | 0 refills | Status: DC

## 2019-12-10 NOTE — Telephone Encounter (Signed)
Medication Refill - Medication: amLODipine (NORVASC) 5 MG tablet   Has the patient contacted their pharmacy? No. (Agent: If no, request that the patient contact the pharmacy for the refill.) (Agent: If yes, when and what did the pharmacy advise?)  Preferred Pharmacy (with phone number or street name):  [facility] [address] [place] - [address]  [address] [place] [address]  Phone: [phone] Fax: [phone]      Agent: Please be advised that RX refills may take up to 3 business days. We ask that you follow-up with your pharmacy.

## 2019-12-10 NOTE — Telephone Encounter (Signed)
Accidentally signed, please advise still

## 2020-01-02 ENCOUNTER — Ambulatory Visit: Payer: BC Managed Care – PPO | Admitting: Registered Nurse

## 2020-01-02 ENCOUNTER — Other Ambulatory Visit: Payer: Self-pay

## 2020-01-02 ENCOUNTER — Encounter: Payer: Self-pay | Admitting: Registered Nurse

## 2020-01-02 VITALS — BP 149/92 | HR 80 | Temp 98.0°F | Ht 72.0 in | Wt 174.2 lb

## 2020-01-02 DIAGNOSIS — I10 Essential (primary) hypertension: Secondary | ICD-10-CM

## 2020-01-02 DIAGNOSIS — E559 Vitamin D deficiency, unspecified: Secondary | ICD-10-CM | POA: Diagnosis not present

## 2020-01-02 MED ORDER — AMLODIPINE BESY-BENAZEPRIL HCL 10-20 MG PO CAPS: 1.0000 | ORAL_CAPSULE | Freq: Every day | ORAL | 1 refills | Status: DC

## 2020-01-02 NOTE — Patient Instructions (Signed)
° ° ° °  If you have lab work done today you will be contacted with your lab results within the next 2 weeks.  If you have not heard from us then please contact us. The fastest way to get your results is to register for My Chart. ° ° °IF you received an x-ray today, you will receive an invoice from [facility]. Please contact [facility] at [phone] with questions or concerns regarding your invoice.  ° °IF you received labwork today, you will receive an invoice from [facility]. Please contact [facility] at [phone] with questions or concerns regarding your invoice.  ° °Our billing staff will not be able to assist you with questions regarding bills from these companies. ° °You will be contacted with the lab results as soon as they are available. The fastest way to get your results is to activate your My Chart account. Instructions are located on the last page of this paperwork. If you have not heard from us regarding the results in 2 weeks, please contact this office. °  ° ° ° °

## 2020-01-03 LAB — VITAMIN D 25 HYDROXY (VIT D DEFICIENCY, FRACTURES): Vit D, 25-Hydroxy: 32.7 ng/mL (ref 30.0–100.0)

## 2020-01-03 NOTE — Telephone Encounter (Signed)
Patient requesting a call back regarding this medication. Patient states it is too expensive.

## 2020-01-04 ENCOUNTER — Encounter: Payer: Self-pay | Admitting: Registered Nurse

## 2020-01-04 NOTE — Telephone Encounter (Signed)
Please Advise

## 2020-01-04 NOTE — Telephone Encounter (Signed)
Called pharmacy and the combo pill is $224 out of pocket with no insurance. I have requested them to use Good RX coupon and the price went down to $31.83, which is more reasonable. Pharmacy stated that it is currently ready for pick up.   I have called the pt and informed him of message above. Pt stated that he did have insurance and never gave that info to pharmacy. I have informed him if he did the price of medication may be even lower. Pt stated understanding.

## 2020-01-10 ENCOUNTER — Encounter: Payer: Self-pay | Admitting: Registered Nurse

## 2020-01-10 NOTE — Progress Notes (Signed)
Established Patient Office Visit  Subjective:  Patient ID: Samuel Kim, male    DOB: 08-15-1971  Age: 49 y.o. MRN: [id number]  CC:  Chief Complaint  Patient presents with  . Follow-up    follow up for his blood pressure and also lab work since vitamin d was low.    HPI Samuel Kim presents for BP check and follow up lab work  Has been feeling well - notes that his headaches have improved, his paresthesias in his hands have improved Notes that he has made dramatic improvements to his diet and exercise, he can feel the benefit of these. Tolerating amlodipine well  No past medical history on file.  No past surgical history on file.  No family history on file.  Social History   Socioeconomic History  . Marital status: Single    Spouse name: Not on file  . Number of children: Not on file  . Years of education: Not on file  . Highest education level: Not on file  Occupational History  . Not on file  Tobacco Use  . Smoking status: Current Every Day Smoker  . Smokeless tobacco: Never Used  Substance and Sexual Activity  . Alcohol use: Yes    Alcohol/week: 0.0 standard drinks  . Drug use: Not Currently  . Sexual activity: Not on file  Other Topics Concern  . Not on file  Social History Narrative  . Not on file   Social Determinants of Health   Financial Resource Strain:   . Difficulty of Paying Living Expenses:   Food Insecurity:   . Worried About Programme researcher, broadcasting/film/video in the Last Year:   . Barista in the Last Year:   Transportation Needs:   . Freight forwarder (Medical):   Marland Kitchen Lack of Transportation (Non-Medical):   Physical Activity:   . Days of Exercise per Week:   . Minutes of Exercise per Session:   Stress:   . Feeling of Stress :   Social Connections:   . Frequency of Communication with Friends and Family:   . Frequency of Social Gatherings with Friends and Family:   . Attends Religious Services:   . Active Member of Clubs or Organizations:    . Attends Banker Meetings:   Marland Kitchen Marital Status:   Intimate Partner Violence:   . Fear of Current or Ex-Partner:   . Emotionally Abused:   Marland Kitchen Physically Abused:   . Sexually Abused:     Outpatient Medications Prior to Visit  Medication Sig Dispense Refill  . amLODipine (NORVASC) 5 MG tablet Take 1 tablet (5 mg total) by mouth daily. 60 tablet 0  . Vitamin D, Ergocalciferol, (DRISDOL) 1.25 MG (50000 UNIT) CAPS capsule Take 1 capsule (50,000 Units total) by mouth every 7 (seven) days. 6 capsule 0  . omeprazole (PRILOSEC) 20 MG capsule Take 1 capsule (20 mg total) by mouth daily. 30 capsule 1  . varenicline (CHANTIX CONTINUING MONTH PAK) 1 MG tablet Take 1 tablet (1 mg total) by mouth 2 (two) times daily. 60 tablet 1  . varenicline (CHANTIX STARTING MONTH PAK) 0.5 MG X 11 & 1 MG X 42 tablet Take one 0.5 mg tablet by mouth once daily for 3 days, then increase to one 0.5 mg tablet twice daily for 4 days, then increase to one 1 mg tablet twice daily. 53 tablet 0   No facility-administered medications prior to visit.    No Known Allergies  ROS Review of Systems  Constitutional: Negative.   HENT: Negative.   Eyes: Negative.   Respiratory: Negative.   Cardiovascular: Negative.   Gastrointestinal: Negative.   Endocrine: Negative.   Genitourinary: Negative.   Musculoskeletal: Negative.   Skin: Negative.   Allergic/Immunologic: Negative.   Neurological: Negative.   Hematological: Negative.   Psychiatric/Behavioral: Negative.   All other systems reviewed and are negative.     Objective:    Physical Exam  Constitutional: He is oriented to person, place, and time. He appears well-developed and well-nourished. No distress.  Cardiovascular: Normal rate, regular rhythm and normal heart sounds. Exam reveals no gallop and no friction rub.  No murmur heard. Pulmonary/Chest: Effort normal and breath sounds normal. No respiratory distress. He has no wheezes. He has no rales. He  exhibits no tenderness.  Neurological: He is alert and oriented to person, place, and time.  Skin: Skin is warm and dry. No rash noted. He is not diaphoretic. No erythema. No pallor.  Psychiatric: He has a normal mood and affect. His behavior is normal. Judgment and thought content normal.  Nursing note and vitals reviewed.   BP (!) 149/92   Pulse 80   Temp 98 F (36.7 C) (Temporal)   Ht 6' (1.829 m)   Wt 174 lb 3.2 oz (79 kg)   SpO2 99%   BMI 23.63 kg/m  Wt Readings from Last 3 Encounters:  01/02/20 174 lb 3.2 oz (79 kg)  11/12/19 170 lb 3.2 oz (77.2 kg)  10/26/19 173 lb 9.6 oz (78.7 kg)     There are no preventive care reminders to display for this patient.  There are no preventive care reminders to display for this patient.  No results found for: TSH Lab Results  Component Value Date   WBC 7.5 10/22/2019   HGB 17.3 (H) 10/22/2019   HCT 49.7 10/22/2019   MCV 99.6 10/22/2019   PLT 153 10/22/2019   Lab Results  Component Value Date   NA 136 11/12/2019   K 3.9 11/12/2019   CO2 22 11/12/2019   GLUCOSE 103 (H) 11/12/2019   BUN 8 11/12/2019   CREATININE 0.95 11/12/2019   BILITOT 0.5 11/12/2019   ALKPHOS 113 11/12/2019   AST 19 11/12/2019   ALT 9 11/12/2019   PROT 7.1 11/12/2019   ALBUMIN 4.6 11/12/2019   CALCIUM 9.6 11/12/2019   ANIONGAP 12 10/22/2019   Lab Results  Component Value Date   CHOL 173 11/12/2019   Lab Results  Component Value Date   HDL 41 11/12/2019   Lab Results  Component Value Date   LDLCALC 101 (H) 11/12/2019   Lab Results  Component Value Date   TRIG 175 (H) 11/12/2019   Lab Results  Component Value Date   CHOLHDL 4.2 11/12/2019   Lab Results  Component Value Date   HGBA1C 5.3 11/12/2019      Assessment & Plan:   Problem List Items Addressed This Visit    None    Visit Diagnoses    Vitamin D deficiency    -  Primary   Relevant Orders   Vitamin D, 25-hydroxy (Completed)   Essential hypertension, benign        Relevant Medications   amLODipine-benazepril (LOTREL) 10-20 MG capsule      Meds ordered this encounter  Medications  . amLODipine-benazepril (LOTREL) 10-20 MG capsule    Sig: Take 1 capsule by mouth daily.    Dispense:  90 capsule    Refill:  1    Order Specific Question:  Supervising Provider    Answer:   Forrest Moron [[id number]]    Follow-up: No follow-ups on file.   PLAN  BP still elevated, will increase to amlodipine benazepril 10-20mg  Po qd  [facility] lab for vit d, will follow up as warranted  Patient encouraged to call clinic with any questions, comments, or concerns.  Maximiano Coss, NP

## 2020-06-25 ENCOUNTER — Encounter: Payer: Self-pay | Admitting: Registered Nurse

## 2020-06-25 ENCOUNTER — Ambulatory Visit: Payer: BC Managed Care – PPO | Admitting: Registered Nurse

## 2020-06-25 ENCOUNTER — Other Ambulatory Visit: Payer: Self-pay

## 2020-06-25 DIAGNOSIS — I10 Essential (primary) hypertension: Secondary | ICD-10-CM | POA: Diagnosis not present

## 2020-06-25 MED ORDER — AMLODIPINE BESY-BENAZEPRIL HCL 10-20 MG PO CAPS: 1.0000 | ORAL_CAPSULE | Freq: Every day | ORAL | 1 refills | Status: DC

## 2020-06-25 NOTE — Patient Instructions (Addendum)
Continue meds  Great work on the lifestyle modifications. See you in 1 year.    If you have lab work done today you will be contacted with your lab results within the next 2 weeks.  If you have not heard from [place] then please contact us. The fastest way to get your results is to register for My Chart.   IF you received an x-ray today, you will receive an invoice from [facility]. Please contact [facility] at [phone] with questions or concerns regarding your invoice.   IF you received labwork today, you will receive an invoice from [facility]. Please contact [facility] at [phone] with questions or concerns regarding your invoice.   Our billing staff will not be able to assist you with questions regarding bills from these companies.  You will be contacted with the lab results as soon as they are available. The fastest way to get your results is to activate your My Chart account. Instructions are located on the last page of this paperwork. If you have not heard from [place] regarding the results in 2 weeks, please contact this office.

## 2020-06-25 NOTE — Progress Notes (Signed)
Established Patient Office Visit  Subjective:  Patient ID: Samuel Kim, male    DOB: [birth year]  Age: 49 y.o. MRN: [id number]  CC:  Chief Complaint  Patient presents with  . Follow-up    6 month follow up patient has no other questions or concerns  . Medication Refill    amlodipine 10 mg    HPI Samuel Kim presents for HTN 6 mo check  Feeling well - amlodipine 10mg  po qd with good compliance. No AEs.  No CV symptoms. Checks home bp - 120-135/80-90. Usually 125/80 or so. Has improved diet - cut out fast food, cut out high sodium foods, decreased red meat.   No other concerns or complaints at this time.   No past medical history on file.  No past surgical history on file.  No family history on file.  Social History   Socioeconomic History  . Marital status: Single    Spouse name: Not on file  . Number of children: Not on file  . Years of education: Not on file  . Highest education level: Not on file  Occupational History  . Not on file  Tobacco Use  . Smoking status: Current Every Day Smoker  . Smokeless tobacco: Never Used  Substance and Sexual Activity  . Alcohol use: Yes    Alcohol/week: 0.0 standard drinks  . Drug use: Not Currently  . Sexual activity: Not on file  Other Topics Concern  . Not on file  Social History Narrative  . Not on file   Social Determinants of Health   Financial Resource Strain:   . Difficulty of Paying Living Expenses: Not on file  Food Insecurity:   . Worried About in the Last Year: Not on file  . Ran Out of Food in the Last Year: Not on file  Transportation Needs:   . Lack of Transportation (Medical): Not on file  . Lack of Transportation (Non-Medical): Not on file  Physical Activity:   . Days of Exercise per Week: Not on file  . Minutes of Exercise per Session: Not on file  Stress:   . Feeling of Stress : Not on file  Social Connections:   . Frequency of Communication with Friends and Family: Not  on file  . Frequency of Social Gatherings with Friends and Family: Not on file  . Attends Religious Services: Not on file  . Active Member of Clubs or Organizations: Not on file  . Attends Programme researcher, broadcasting/film/video Meetings: Not on file  . Marital Status: Not on file  Intimate Partner Violence:   . Fear of Current or Ex-Partner: Not on file  . Emotionally Abused: Not on file  . Physically Abused: Not on file  . Sexually Abused: Not on file    Outpatient Medications Prior to Visit  Medication Sig Dispense Refill  . amLODipine-benazepril (LOTREL) 10-20 MG capsule Take 1 capsule by mouth daily. 90 capsule 1  . amLODipine (NORVASC) 5 MG tablet Take 1 tablet (5 mg total) by mouth daily. (Patient not taking: Reported on 06/25/2020) 60 tablet 0  . Vitamin D, Ergocalciferol, (DRISDOL) 1.25 MG (50000 UNIT) CAPS capsule Take 1 capsule (50,000 Units total) by mouth every 7 (seven) days. (Patient not taking: Reported on 06/25/2020) 6 capsule 0   No facility-administered medications prior to visit.    No Known Allergies  ROS Review of Systems  Constitutional: Negative.   HENT: Negative.   Eyes: Negative.   Respiratory: Negative.   Cardiovascular:  Negative.   Gastrointestinal: Negative.   Endocrine: Negative.   Genitourinary: Negative.   Musculoskeletal: Negative.   Skin: Negative.   Allergic/Immunologic: Negative.   Neurological: Negative.   Hematological: Negative.   Psychiatric/Behavioral: Negative.   All other systems reviewed and are negative.     Objective:    Physical Exam Vitals and nursing note reviewed.  Constitutional:      General: He is not in acute distress.    Appearance: Normal appearance. He is normal weight. He is not ill-appearing, toxic-appearing or diaphoretic.  Cardiovascular:     Rate and Rhythm: Normal rate and regular rhythm.  Pulmonary:     Effort: Pulmonary effort is normal. No respiratory distress.  Musculoskeletal:        General: Normal range of motion.    Skin:    General: Skin is warm and dry.  Neurological:     General: No focal deficit present.     Mental Status: He is alert and oriented to person, place, and time. Mental status is at baseline.  Psychiatric:        Mood and Affect: Mood normal.        Behavior: Behavior normal.        Thought Content: Thought content normal.        Judgment: Judgment normal.     BP 125/81   Pulse 75   Temp 98.1 F (36.7 C) (Temporal)   Resp 18   Ht 6' (1.829 m)   Wt 171 lb 12.8 oz (77.9 kg)   SpO2 99%   BMI 23.30 kg/m  Wt Readings from Last 3 Encounters:  06/25/20 171 lb 12.8 oz (77.9 kg)  01/02/20 174 lb 3.2 oz (79 kg)  11/12/19 170 lb 3.2 oz (77.2 kg)     There are no preventive care reminders to display for this patient.  There are no preventive care reminders to display for this patient.  No results found for: TSH Lab Results  Component Value Date   WBC 7.5 10/22/2019   HGB 17.3 (H) 10/22/2019   HCT 49.7 10/22/2019   MCV 99.6 10/22/2019   PLT 153 10/22/2019   Lab Results  Component Value Date   NA 136 11/12/2019   K 3.9 11/12/2019   CO2 22 11/12/2019   GLUCOSE 103 (H) 11/12/2019   BUN 8 11/12/2019   CREATININE 0.95 11/12/2019   BILITOT 0.5 11/12/2019   ALKPHOS 113 11/12/2019   AST 19 11/12/2019   ALT 9 11/12/2019   PROT 7.1 11/12/2019   ALBUMIN 4.6 11/12/2019   CALCIUM 9.6 11/12/2019   ANIONGAP 12 10/22/2019   Lab Results  Component Value Date   CHOL 173 11/12/2019   Lab Results  Component Value Date   HDL 41 11/12/2019   Lab Results  Component Value Date   LDLCALC 101 (H) 11/12/2019   Lab Results  Component Value Date   TRIG 175 (H) 11/12/2019   Lab Results  Component Value Date   CHOLHDL 4.2 11/12/2019   Lab Results  Component Value Date   HGBA1C 5.3 11/12/2019      Assessment & Plan:   Problem List Items Addressed This Visit    None    Visit Diagnoses    Essential hypertension, benign       Relevant Medications    amLODipine-benazepril (LOTREL) 10-20 MG capsule      No orders of the defined types were placed in this encounter.   Follow-up: No follow-ups on file.   PLAN  Continue Lotrel 10-20mg  PO qd.  Return in 1 year for CPE and labs  Patient encouraged to call clinic with any questions, comments, or concerns.  Janeece Agee, NP

## 2020-07-16 IMAGING — DX PORTABLE CHEST - 1 VIEW
1 series · 1 of 1 positions shown · non-contrast
Comparison: None.

CLINICAL DATA: Pt reports onset of headache, fever, and fatigue on
wed. Pt denies CP or SOB. Pt is a smoker. No medical or surgical hx
on file

EXAM:
PORTABLE CHEST 1 VIEW

[chest ap]
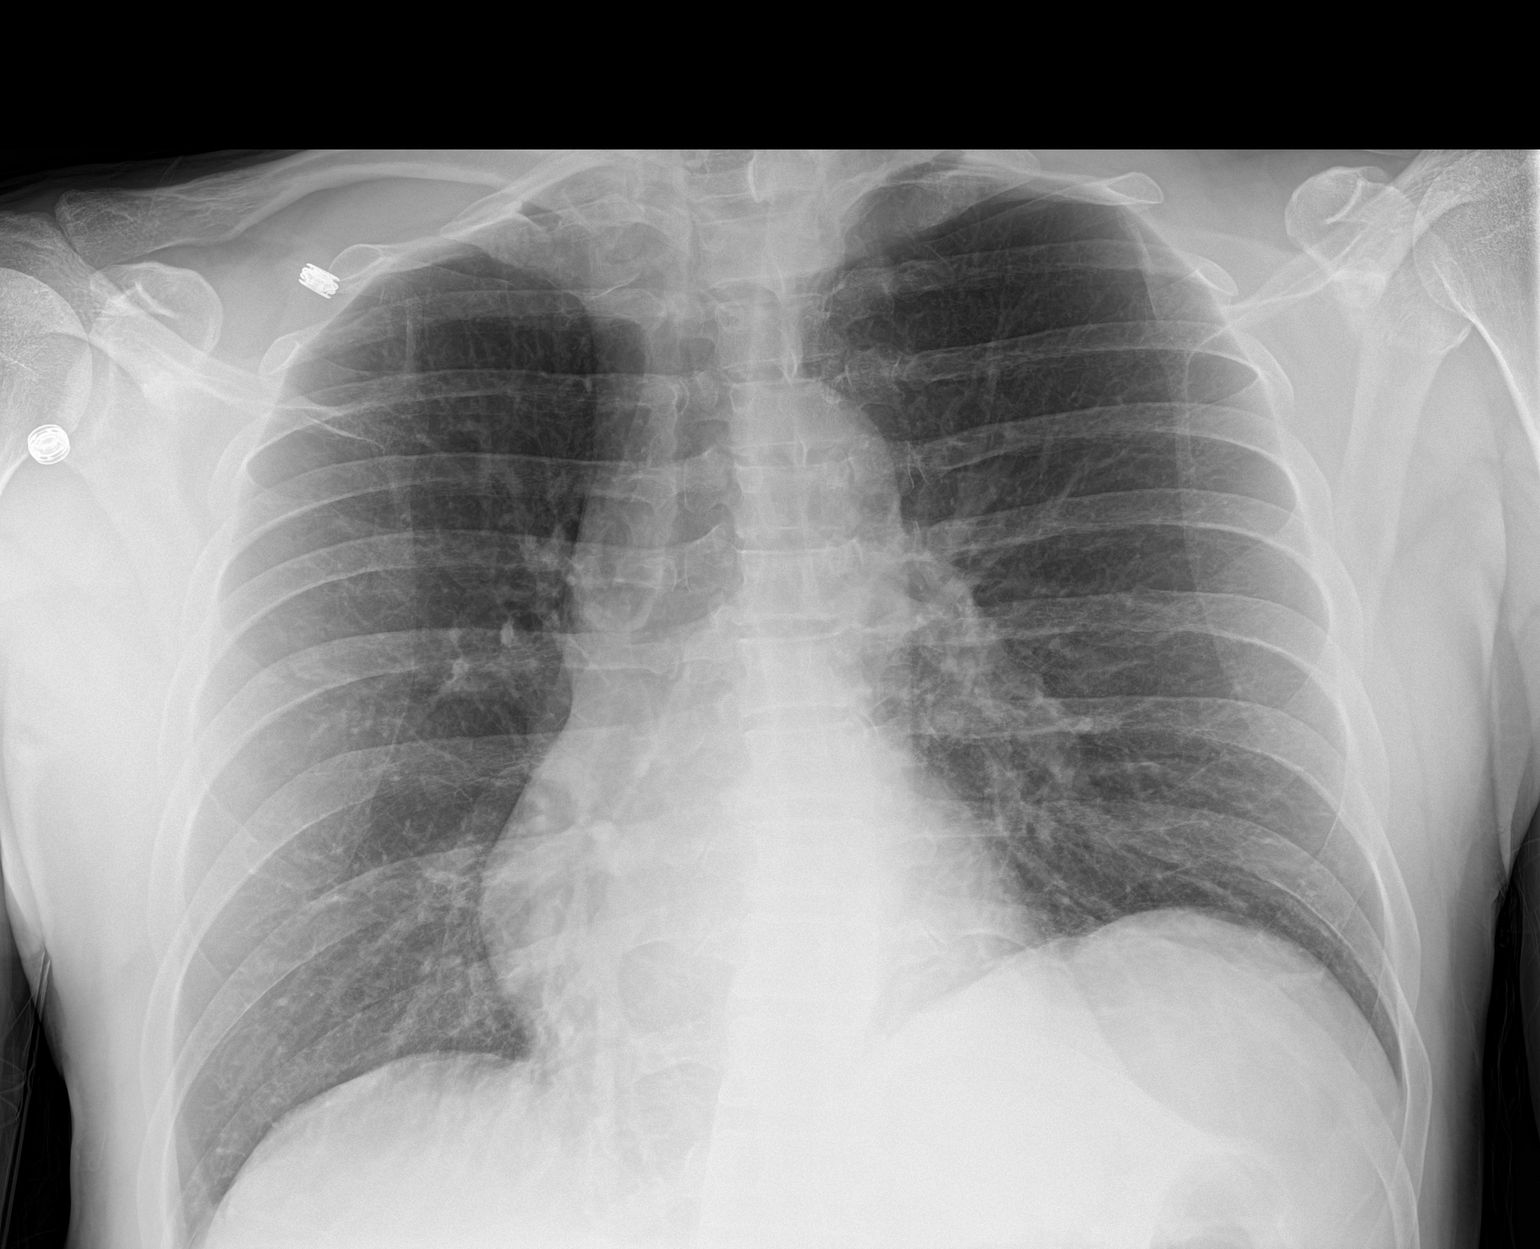

[1 of 1 positions shown; findings below may reference images not displayed]

FINDINGS: Cardiac silhouette is normal in size. No mediastinal or hilar
masses. No evidence of adenopathy.

Clear lungs.  No pleural effusion or pneumothorax.

Skeletal structures are grossly intact.
IMPRESSION: No active disease.

## 2020-10-27 ENCOUNTER — Telehealth (INDEPENDENT_AMBULATORY_CARE_PROVIDER_SITE_OTHER): Payer: BC Managed Care – PPO | Admitting: Registered Nurse

## 2020-10-27 ENCOUNTER — Other Ambulatory Visit: Payer: Self-pay

## 2020-10-27 VITALS — BP 115/76

## 2020-10-27 DIAGNOSIS — Z20822 Contact with and (suspected) exposure to covid-19: Secondary | ICD-10-CM

## 2020-10-27 NOTE — Progress Notes (Signed)
Telemedicine Encounter- SOAP NOTE Established Patient  This telephone encounter was conducted with the patient's (or proxy's) verbal consent via audio telecommunications: yes  Patient was instructed to have this encounter in a suitably private space; and to only have persons present to whom they give permission to participate. In addition, patient identity was confirmed by use of name plus two identifiers (DOB and address).  I discussed the limitations, risks, security and privacy concerns of performing an evaluation and management service by telephone and the availability of in person appointments. I also discussed with the patient that there may be a patient responsible charge related to this service. The patient expressed understanding and agreed to proceed.  I spent a total of 14 minutes talking with the patient or their proxy.  Patient at home Provider in office  Chief Complaint  Patient presents with  . loss taste    Pt started loosing teste and smell Wednesday, had test Thursday negative. Pt reports has been improving, has a slight cough not deep. No SOB, BP from last night avgs 130/84 last night was 96/64     Subjective   Samuel Kim is a 50 y.o. established patient. Telephone visit today for covid sxs  HPI Lost taste and smell, slight cough, slight headache Onset last Wednesday All have drastically improved with OTCs, pushing fluids, and rest Pt looking to return to work when possible, wanted to ensure he is addressing this appropriately No worsening symptoms or further concerns.   Patient Active Problem List   Diagnosis Date Noted  . Palpitations 11/12/2019  . Essential hypertension 11/12/2019  . Paresthesia and pain of both upper extremities 11/12/2019    No past medical history on file.  Current Outpatient Medications  Medication Sig Dispense Refill  . amLODipine-benazepril (LOTREL) 10-20 MG capsule Take 1 capsule by mouth daily. 90 capsule 1   No current  facility-administered medications for this visit.    No Known Allergies  Social History   Socioeconomic History  . Marital status: Single    Spouse name: Not on file  . Number of children: Not on file  . Years of education: Not on file  . Highest education level: Not on file  Occupational History  . Not on file  Tobacco Use  . Smoking status: Current Every Day Smoker  . Smokeless tobacco: Never Used  Substance and Sexual Activity  . Alcohol use: Yes    Alcohol/week: 0.0 standard drinks  . Drug use: Not Currently  . Sexual activity: Not on file  Other Topics Concern  . Not on file  Social History Narrative  . Not on file   Social Determinants of Health   Financial Resource Strain: Not on file  Food Insecurity: Not on file  Transportation Needs: Not on file  Physical Activity: Not on file  Stress: Not on file  Social Connections: Not on file  Intimate Partner Violence: Not on file    ROS Per hpi   Objective   Vitals as reported by the patient: Today's Vitals   10/27/20 1101  BP: 115/76    Enrigue was seen today for loss taste.  Diagnoses and all orders for this visit:  Suspected COVID-19 virus infection   PLAN  Discussed benefits of testing, though I dont feel this is necessary. 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   I discussed the assessment and treatment plan with the patient. The patient was provided an opportunity to ask questions and all were answered. The patient agreed with the plan and demonstrated an understanding of the instructions.   The patient was advised to call back or seek an in-person evaluation if the symptoms worsen or if the condition fails to improve as anticipated.  I provided 14 minutes of non-face-to-face time during this encounter.  Janeece Agee, NP  Primary  Care at [facility]

## 2020-10-27 NOTE — Patient Instructions (Signed)
° ° ° °  If you have lab work done today you will be contacted with your lab results within the next 2 weeks.  If you have not heard from us then please contact us. The fastest way to get your results is to register for My Chart. ° ° °IF you received an x-ray today, you will receive an invoice from [facility]. Please contact [facility] at [phone] with questions or concerns regarding your invoice.  ° °IF you received labwork today, you will receive an invoice from [facility]. Please contact [facility] at [phone] with questions or concerns regarding your invoice.  ° °Our billing staff will not be able to assist you with questions regarding bills from these companies. ° °You will be contacted with the lab results as soon as they are available. The fastest way to get your results is to activate your My Chart account. Instructions are located on the last page of this paperwork. If you have not heard from us regarding the results in 2 weeks, please contact this office. °  ° ° ° °

## 2020-12-23 ENCOUNTER — Encounter: Payer: Self-pay | Admitting: Registered Nurse

## 2020-12-23 ENCOUNTER — Other Ambulatory Visit: Payer: Self-pay

## 2020-12-23 ENCOUNTER — Ambulatory Visit: Payer: BC Managed Care – PPO | Admitting: Registered Nurse

## 2020-12-23 VITALS — BP 118/77 | HR 72 | Temp 98.0°F | Resp 18 | Ht 72.0 in | Wt 170.8 lb

## 2020-12-23 DIAGNOSIS — R432 Parageusia: Secondary | ICD-10-CM | POA: Diagnosis not present

## 2020-12-23 DIAGNOSIS — I10 Essential (primary) hypertension: Secondary | ICD-10-CM

## 2020-12-23 NOTE — Patient Instructions (Signed)
° ° ° °  If you have lab work done today you will be contacted with your lab results within the next 2 weeks.  If you have not heard from us then please contact us. The fastest way to get your results is to register for My Chart. ° ° °IF you received an x-ray today, you will receive an invoice from [facility]. Please contact [facility] at [phone] with questions or concerns regarding your invoice.  ° °IF you received labwork today, you will receive an invoice from [facility]. Please contact [facility] at [phone] with questions or concerns regarding your invoice.  ° °Our billing staff will not be able to assist you with questions regarding bills from these companies. ° °You will be contacted with the lab results as soon as they are available. The fastest way to get your results is to activate your My Chart account. Instructions are located on the last page of this paperwork. If you have not heard from us regarding the results in 2 weeks, please contact this office. °  ° ° ° °

## 2020-12-24 ENCOUNTER — Telehealth: Payer: Self-pay | Admitting: Registered Nurse

## 2020-12-24 NOTE — Telephone Encounter (Signed)
12/24/2020 - PATIENT WAS IN THE OFFICE TO SEE RICH MORROW Tuesday (12/23/2020) TO GET HIS BLOOD PRESSURE MEDICATION REFILLED. WHEN HE WENT TO THE PHARMACY THEY SAID NOTHING HAD BEEN SENT. PLEASE SEND AS SOON AS POSSIBLE PLEASE. BEST PHONE FOR PATIENT IS: [phone] (CELL)  PHARMACY CHOICE IS: [facility] IN [place], [place]   MBC

## 2020-12-25 ENCOUNTER — Other Ambulatory Visit: Payer: Self-pay | Admitting: Emergency Medicine

## 2020-12-25 DIAGNOSIS — I10 Essential (primary) hypertension: Secondary | ICD-10-CM

## 2020-12-25 MED ORDER — AMLODIPINE BESY-BENAZEPRIL HCL 10-20 MG PO CAPS: 1.0000 | ORAL_CAPSULE | Freq: Every day | ORAL | 1 refills | Status: DC

## 2020-12-25 NOTE — Telephone Encounter (Signed)
Rx has been sent today.

## 2020-12-25 NOTE — Telephone Encounter (Signed)
Patient called to follow up on status of Rx for blood pressure medication. He only has 1 pill left.   Pharmacy:   [facility] [address] [place] - [address]  [address], [place] [address]  Phone:  [phone] Fax:  [phone]    Please advise at [phone].

## 2021-02-09 DIAGNOSIS — H04123 Dry eye syndrome of bilateral lacrimal glands: Secondary | ICD-10-CM | POA: Diagnosis not present

## 2021-02-09 DIAGNOSIS — H40033 Anatomical narrow angle, bilateral: Secondary | ICD-10-CM | POA: Diagnosis not present

## 2021-02-09 IMAGING — DX DG STERNUM 2+V
2 series · 2 of 2 positions shown · non-contrast
Comparison: Chest x-ray today and 10/22/2019

CLINICAL DATA: Sternum/chest pain

EXAM:
STERNUM - 2+ VIEW

[sternum obl]
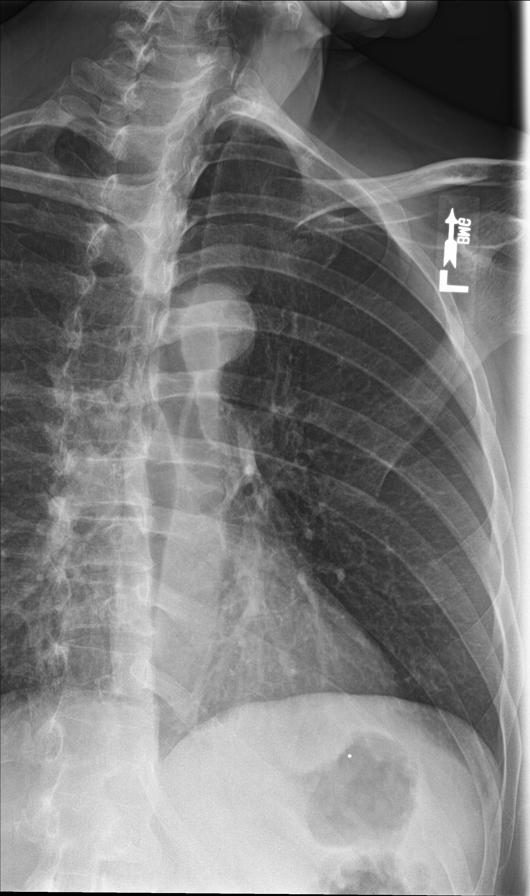

[chest lat]
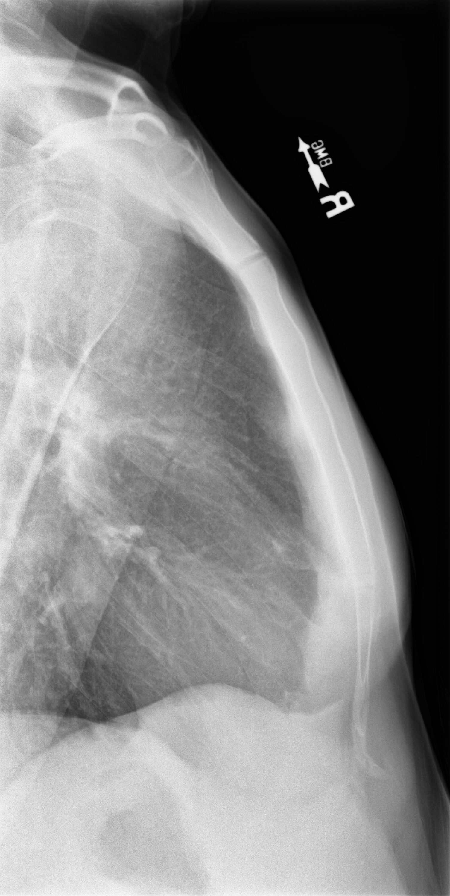

[2 of 2 positions shown; findings below may reference images not displayed]

FINDINGS: There is no evidence of fracture or other focal bone lesions.
IMPRESSION: Negative.

## 2021-06-18 ENCOUNTER — Other Ambulatory Visit: Payer: Self-pay

## 2021-06-18 ENCOUNTER — Telehealth: Payer: Self-pay

## 2021-06-18 DIAGNOSIS — I10 Essential (primary) hypertension: Secondary | ICD-10-CM

## 2021-06-18 MED ORDER — AMLODIPINE BESY-BENAZEPRIL HCL 10-20 MG PO CAPS: 1.0000 | ORAL_CAPSULE | Freq: Every day | ORAL | 0 refills | Status: DC

## 2021-06-18 NOTE — Telephone Encounter (Signed)
Medication sent to pharmacy  

## 2021-06-18 NOTE — Telephone Encounter (Signed)
Pt is callin made a Med Check and Blood Pressure check appt on 09/13 @ 10:30 patient patient will be out of blood pressure meds on the 7th and wants to see if we can send a week supply until he can get in on the 13th this is the next available  office appt?   amLODipine-benazepril (LOTREL) 10-20 MG capsule + [facility] [address] [place] - [address]  [address], [place] [address]   Last vist 03/22 Next appt 09/22  Pt call back [phone]

## 2021-06-19 ENCOUNTER — Other Ambulatory Visit: Payer: Self-pay | Admitting: Registered Nurse

## 2021-06-19 DIAGNOSIS — I10 Essential (primary) hypertension: Secondary | ICD-10-CM

## 2021-06-30 ENCOUNTER — Encounter: Payer: Self-pay | Admitting: Registered Nurse

## 2021-06-30 ENCOUNTER — Other Ambulatory Visit: Payer: Self-pay

## 2021-06-30 ENCOUNTER — Ambulatory Visit: Payer: BC Managed Care – PPO | Admitting: Registered Nurse

## 2021-06-30 VITALS — BP 140/86 | HR 64 | Temp 98.2°F | Resp 16 | Ht 72.0 in | Wt 165.4 lb

## 2021-06-30 DIAGNOSIS — E559 Vitamin D deficiency, unspecified: Secondary | ICD-10-CM

## 2021-06-30 DIAGNOSIS — Z1329 Encounter for screening for other suspected endocrine disorder: Secondary | ICD-10-CM | POA: Diagnosis not present

## 2021-06-30 DIAGNOSIS — I10 Essential (primary) hypertension: Secondary | ICD-10-CM | POA: Diagnosis not present

## 2021-06-30 LAB — COMPREHENSIVE METABOLIC PANEL
ALT: 8 U/L (ref 0–53)
AST: 14 U/L (ref 0–37)
Albumin: 4.5 g/dL (ref 3.5–5.2)
Alkaline Phosphatase: 82 U/L (ref 39–117)
BUN: 9 mg/dL (ref 6–23)
CO2: 29 mEq/L (ref 19–32)
Calcium: 9.4 mg/dL (ref 8.4–10.5)
Chloride: 100 mEq/L (ref 96–112)
Creatinine, Ser: 0.9 mg/dL (ref 0.40–1.50)
GFR: 100.41 mL/min (ref >60.00)
Glucose, Bld: 92 mg/dL (ref 70–99)
Potassium: 4.6 mEq/L (ref 3.5–5.1)
Sodium: 136 mEq/L (ref 135–145)
Total Bilirubin: 0.6 mg/dL (ref 0.2–1.2)
Total Protein: 7.5 g/dL (ref 6.0–8.3)

## 2021-06-30 LAB — LIPID PANEL
Cholesterol: 187 mg/dL (ref 0–200)
HDL: 47.8 mg/dL (ref >39.00)
LDL Cholesterol: 117 mg/dL — ABNORMAL HIGH (ref 0–99)
NonHDL: 139.1
Total CHOL/HDL Ratio: 4
Triglycerides: 109 mg/dL (ref 0.0–149.0)
VLDL: 21.8 mg/dL (ref 0.0–40.0)

## 2021-06-30 LAB — CBC WITH DIFFERENTIAL/PLATELET
Basophils Absolute: 0 10*3/uL (ref 0.0–0.1)
Basophils Relative: 0.7 % (ref 0.0–3.0)
Eosinophils Absolute: 0.1 10*3/uL (ref 0.0–0.7)
Eosinophils Relative: 1.4 % (ref 0.0–5.0)
HCT: 49.8 % (ref 39.0–52.0)
Hemoglobin: 16.9 g/dL (ref 13.0–17.0)
Lymphocytes Relative: 24.9 % (ref 12.0–46.0)
Lymphs Abs: 1.4 10*3/uL (ref 0.7–4.0)
MCHC: 34 g/dL (ref 30.0–36.0)
MCV: 101.8 fl — ABNORMAL HIGH (ref 78.0–100.0)
Monocytes Absolute: 0.3 10*3/uL (ref 0.1–1.0)
Monocytes Relative: 5.9 % (ref 3.0–12.0)
Neutro Abs: 3.9 10*3/uL (ref 1.4–7.7)
Neutrophils Relative %: 67.1 % (ref 43.0–77.0)
Platelets: 147 10*3/uL — ABNORMAL LOW (ref 150.0–400.0)
RBC: 4.89 Mil/uL (ref 4.22–5.81)
RDW: 12.6 % (ref 11.5–15.5)
WBC: 5.8 10*3/uL (ref 4.0–10.5)

## 2021-06-30 LAB — HEMOGLOBIN A1C: Hgb A1c MFr Bld: 5.5 % (ref 4.6–6.5)

## 2021-06-30 LAB — TSH: TSH: 0.61 u[IU]/mL (ref 0.35–5.50)

## 2021-06-30 LAB — VITAMIN D 25 HYDROXY (VIT D DEFICIENCY, FRACTURES): VITD: 33.21 ng/mL (ref 30.00–100.00)

## 2021-06-30 MED ORDER — AMLODIPINE BESY-BENAZEPRIL HCL 10-20 MG PO CAPS: 1.0000 | ORAL_CAPSULE | Freq: Every day | ORAL | 3 refills | Status: DC

## 2021-06-30 NOTE — Patient Instructions (Addendum)
[name] -   Randie Heinz to see you, as always  Glad things are steady.  Given how steady they are, let's go to once annual follow ups for a head to toe physical and blood work. These visits often knock a few bucks off of insurance costs, plus we check blood pressure and labs that we'd need to anyway.  Call with concerns in the mean time  Thank you  Rich     If you have lab work done today you will be contacted with your lab results within the next 2 weeks.  If you have not heard from [place] then please contact us. The fastest way to get your results is to register for My Chart.   IF you received an x-ray today, you will receive an invoice from [facility]. Please contact [facility] at [phone] with questions or concerns regarding your invoice.   IF you received labwork today, you will receive an invoice from [facility]. Please contact [facility] at [phone] with questions or concerns regarding your invoice.   Our billing staff will not be able to assist you with questions regarding bills from these companies.  You will be contacted with the lab results as soon as they are available. The fastest way to get your results is to activate your My Chart account. Instructions are located on the last page of this paperwork. If you have not heard from [place] regarding the results in 2 weeks, please contact this office.

## 2021-06-30 NOTE — Progress Notes (Signed)
Established Patient Office Visit  Subjective:  Patient ID: Samuel Kim, male    DOB: 1970/11/16  Age: 50 y.o. MRN: [id number]  CC:  Chief Complaint  Patient presents with  . Follow-up    Patient states he is here for a BP check no other concerns    HPI Samuel Kim presents for HTN follow up  Hypertension: Patient Currently taking: amlodipine-benazepril 10-20mg  po qd Good effect. No AEs. Denies CV symptoms including: chest pain, shob, doe, headache, visual changes, fatigue, claudication, and dependent edema.   Previous readings and labs: BP Readings from Last 3 Encounters:  06/30/21 140/86  12/23/20 118/77  10/27/20 115/76   Lab Results  Component Value Date   CREATININE 0.95 11/12/2019     Otherwise doing well and without complaint.  No past medical history on file.  No past surgical history on file.  No family history on file.  Social History   Socioeconomic History  . Marital status: Single    Spouse name: Not on file  . Number of children: Not on file  . Years of education: Not on file  . Highest education level: Not on file  Occupational History  . Not on file  Tobacco Use  . Smoking status: Every Day  . Smokeless tobacco: Never  Substance and Sexual Activity  . Alcohol use: Yes    Alcohol/week: 0.0 standard drinks  . Drug use: Not Currently  . Sexual activity: Not on file  Other Topics Concern  . Not on file  Social History Narrative  . Not on file   Social Determinants of Health   Financial Resource Strain: Not on file  Food Insecurity: Not on file  Transportation Needs: Not on file  Physical Activity: Not on file  Stress: Not on file  Social Connections: Not on file  Intimate Partner Violence: Not on file    Outpatient Medications Prior to Visit  Medication Sig Dispense Refill  . amLODipine-benazepril (LOTREL) 10-20 MG capsule Take 1 capsule by mouth daily. 30 capsule 0   No facility-administered medications prior to visit.     No Known Allergies  ROS Review of Systems  Constitutional: Negative.   HENT: Negative.    Eyes: Negative.   Respiratory: Negative.    Cardiovascular: Negative.   Gastrointestinal: Negative.   Genitourinary: Negative.   Musculoskeletal: Negative.   Skin: Negative.   Neurological: Negative.   Psychiatric/Behavioral: Negative.    All other systems reviewed and are negative.    Objective:    Physical Exam Constitutional:      General: He is not in acute distress.    Appearance: Normal appearance. He is normal weight. He is not ill-appearing, toxic-appearing or diaphoretic.  Cardiovascular:     Rate and Rhythm: Normal rate and regular rhythm.     Heart sounds: Normal heart sounds. No murmur heard.   No friction rub. No gallop.  Pulmonary:     Effort: Pulmonary effort is normal. No respiratory distress.     Breath sounds: Normal breath sounds. No stridor. No wheezing, rhonchi or rales.  Chest:     Chest wall: No tenderness.  Neurological:     General: No focal deficit present.     Mental Status: He is alert and oriented to person, place, and time. Mental status is at baseline.  Psychiatric:        Mood and Affect: Mood normal.        Behavior: Behavior normal.        Thought Content:  Thought content normal.        Judgment: Judgment normal.    BP 140/86   Pulse 64   Temp 98.2 F (36.8 C) (Temporal)   Resp 16   Ht 6' (1.829 m)   Wt 165 lb 6.4 oz (75 kg)   BMI 22.43 kg/m  Wt Readings from Last 3 Encounters:  06/30/21 165 lb 6.4 oz (75 kg)  12/23/20 170 lb 12.8 oz (77.5 kg)  06/25/20 171 lb 12.8 oz (77.9 kg)     There are no preventive care reminders to display for this patient.  There are no preventive care reminders to display for this patient.  No results found for: TSH Lab Results  Component Value Date   WBC 7.5 10/22/2019   HGB 17.3 (H) 10/22/2019   HCT 49.7 10/22/2019   MCV 99.6 10/22/2019   PLT 153 10/22/2019   Lab Results  Component Value  Date   NA 136 11/12/2019   K 3.9 11/12/2019   CO2 22 11/12/2019   GLUCOSE 103 (H) 11/12/2019   BUN 8 11/12/2019   CREATININE 0.95 11/12/2019   BILITOT 0.5 11/12/2019   ALKPHOS 113 11/12/2019   AST 19 11/12/2019   ALT 9 11/12/2019   PROT 7.1 11/12/2019   ALBUMIN 4.6 11/12/2019   CALCIUM 9.6 11/12/2019   ANIONGAP 12 10/22/2019   Lab Results  Component Value Date   CHOL 173 11/12/2019   Lab Results  Component Value Date   HDL 41 11/12/2019   Lab Results  Component Value Date   LDLCALC 101 (H) 11/12/2019   Lab Results  Component Value Date   TRIG 175 (H) 11/12/2019   Lab Results  Component Value Date   CHOLHDL 4.2 11/12/2019   Lab Results  Component Value Date   HGBA1C 5.3 11/12/2019      Assessment & Plan:   Problem List Items Addressed This Visit   None   No orders of the defined types were placed in this encounter.   Follow-up: No follow-ups on file.   PLAN Refill x 1 year Return for CPE and labs Labs collected. Will follow up with the patient as warranted. Patient encouraged to call clinic with any questions, comments, or concerns.  Janeece Agee, NP

## 2021-07-01 NOTE — Progress Notes (Signed)
No action needed

## 2021-08-31 NOTE — Progress Notes (Signed)
Established Patient Office Visit  Subjective:  Patient ID: Samuel Kim, male    DOB: March 23, 1971  Age: 50 y.o. MRN: [id number]  CC:  Chief Complaint  Patient presents with   Follow-up    Patient states he is here for a recheck of his blood pressure. Patient states he got sick in January and took two covid test and does not have taste or smell .    HPI Samuel Kim presents for htn  Hypertension: Patient Currently taking: amlodipine-benazepril 10-20mg  po qd Good effect. No AEs. Denies CV symptoms including: chest pain, shob, doe, headache, visual changes, fatigue, claudication, and dependent edema.   Previous readings and labs: BP Readings from Last 3 Encounters:  06/30/21 140/86  12/23/20 118/77  10/27/20 115/76   Lab Results  Component Value Date   CREATININE 0.90 06/30/2021    Of note, got ill in January - essentially had all symptoms of covid, but 2x negative test. Still no taste or smell. Otherwise symptoms resolved with conservative measures. Interested to know if there is anything to be done at this time.   No past medical history on file.  No past surgical history on file.  No family history on file.  Social History   Socioeconomic History   Marital status: Single    Spouse name: Not on file   Number of children: Not on file   Years of education: Not on file   Highest education level: Not on file  Occupational History   Not on file  Tobacco Use   Smoking status: Every Day   Smokeless tobacco: Never  Substance and Sexual Activity   Alcohol use: Yes    Alcohol/week: 0.0 standard drinks   Drug use: Not Currently   Sexual activity: Not on file  Other Topics Concern   Not on file  Social History Narrative   Not on file   Social Determinants of Health   Financial Resource Strain: Not on file  Food Insecurity: Not on file  Transportation Needs: Not on file  Physical Activity: Not on file  Stress: Not on file  Social Connections: Not on file   Intimate Partner Violence: Not on file    Outpatient Medications Prior to Visit  Medication Sig Dispense Refill   amLODipine-benazepril (LOTREL) 10-20 MG capsule Take 1 capsule by mouth daily. 90 capsule 1   No facility-administered medications prior to visit.    No Known Allergies  ROS Review of Systems  Constitutional: Negative.   HENT: Negative.    Eyes: Negative.   Respiratory: Negative.    Cardiovascular: Negative.   Gastrointestinal: Negative.   Genitourinary: Negative.   Musculoskeletal: Negative.   Skin: Negative.   Neurological: Negative.   Psychiatric/Behavioral: Negative.    All other systems reviewed and are negative.    Objective:    Physical Exam Constitutional:      General: He is not in acute distress.    Appearance: Normal appearance. He is normal weight. He is not ill-appearing, toxic-appearing or diaphoretic.  Cardiovascular:     Rate and Rhythm: Normal rate and regular rhythm.     Heart sounds: Normal heart sounds. No murmur heard.   No friction rub. No gallop.  Pulmonary:     Effort: Pulmonary effort is normal. No respiratory distress.     Breath sounds: Normal breath sounds. No stridor. No wheezing, rhonchi or rales.  Chest:     Chest wall: No tenderness.  Neurological:     General: No focal deficit present.  Mental Status: He is alert and oriented to person, place, and time. Mental status is at baseline.  Psychiatric:        Mood and Affect: Mood normal.        Behavior: Behavior normal.        Thought Content: Thought content normal.        Judgment: Judgment normal.    BP 118/77   Pulse 72   Temp 98 F (36.7 C) (Temporal)   Resp 18   Ht 6' (1.829 m)   Wt 170 lb 12.8 oz (77.5 kg)   SpO2 96%   BMI 23.16 kg/m  Wt Readings from Last 3 Encounters:  06/30/21 165 lb 6.4 oz (75 kg)  12/23/20 170 lb 12.8 oz (77.5 kg)  06/25/20 171 lb 12.8 oz (77.9 kg)     Health Maintenance Due  Topic Date Due   COVID-19 Vaccine (1) Never  done    There are no preventive care reminders to display for this patient.  Lab Results  Component Value Date   TSH 0.61 06/30/2021   Lab Results  Component Value Date   WBC 5.8 06/30/2021   HGB 16.9 06/30/2021   HCT 49.8 06/30/2021   MCV 101.8 (H) 06/30/2021   PLT 147.0 (L) 06/30/2021   Lab Results  Component Value Date   NA 136 06/30/2021   K 4.6 06/30/2021   CO2 29 06/30/2021   GLUCOSE 92 06/30/2021   BUN 9 06/30/2021   CREATININE 0.90 06/30/2021   BILITOT 0.6 06/30/2021   ALKPHOS 82 06/30/2021   AST 14 06/30/2021   ALT 8 06/30/2021   PROT 7.5 06/30/2021   ALBUMIN 4.5 06/30/2021   CALCIUM 9.4 06/30/2021   ANIONGAP 12 10/22/2019   GFR 100.41 06/30/2021   Lab Results  Component Value Date   CHOL 187 06/30/2021   Lab Results  Component Value Date   HDL 47.80 06/30/2021   Lab Results  Component Value Date   LDLCALC 117 (H) 06/30/2021   Lab Results  Component Value Date   TRIG 109.0 06/30/2021   Lab Results  Component Value Date   CHOLHDL 4 06/30/2021   Lab Results  Component Value Date   HGBA1C 5.5 06/30/2021      Assessment & Plan:   Problem List Items Addressed This Visit       Cardiovascular and Mediastinum   Essential hypertension - Primary   Other Visit Diagnoses     Loss of taste           No orders of the defined types were placed in this encounter.   Follow-up: No follow-ups on file.   PLAN BP well controlled. Continue current regimen. Follow up in 6 mo for CPE and labs Likely covid based on symptoms. Discussed timeline for sensory disturbance - not very well established, but he is long past point of transmissibility. Ok to vaccinate 3 mo post infection. Patient encouraged to call clinic with any questions, comments, or concerns.  Janeece Agee, NP

## 2022-05-28 ENCOUNTER — Telehealth: Payer: Self-pay | Admitting: Registered Nurse

## 2022-05-28 NOTE — Telephone Encounter (Signed)
I have called and made patient an appointment for 07/07/2022 8:40 am

## 2022-05-28 NOTE — Telephone Encounter (Signed)
Encourage patient to contact the pharmacy for refills or they can request refills through [facility]  (Please schedule appointment if patient has not been seen in over a year)    WHAT PHARMACY WOULD THEY LIKE THIS SENT TO: [facility] [place] [phone]  MEDICATION NAME & DOSE: Lotrel 10-20 mg  NOTES/COMMENTS FROM PATIENT:      Front office please notify patient: It takes 48-72 hours to process rx refill requests Ask patient to call pharmacy to ensure rx is ready before heading there.

## 2022-07-02 ENCOUNTER — Encounter: Payer: BC Managed Care – PPO | Admitting: Registered Nurse

## 2022-07-06 ENCOUNTER — Encounter: Payer: BC Managed Care – PPO | Admitting: Registered Nurse

## 2022-07-08 ENCOUNTER — Encounter: Payer: Self-pay | Admitting: Family Medicine

## 2022-07-08 ENCOUNTER — Ambulatory Visit: Payer: BC Managed Care – PPO | Admitting: Family Medicine

## 2022-07-08 VITALS — BP 124/80 | HR 73 | Temp 97.8°F | Ht 72.0 in | Wt 166.8 lb

## 2022-07-08 DIAGNOSIS — I10 Essential (primary) hypertension: Secondary | ICD-10-CM

## 2022-07-08 DIAGNOSIS — Z1211 Encounter for screening for malignant neoplasm of colon: Secondary | ICD-10-CM

## 2022-07-08 DIAGNOSIS — E559 Vitamin D deficiency, unspecified: Secondary | ICD-10-CM | POA: Diagnosis not present

## 2022-07-08 DIAGNOSIS — Z114 Encounter for screening for human immunodeficiency virus [HIV]: Secondary | ICD-10-CM | POA: Diagnosis not present

## 2022-07-08 DIAGNOSIS — E7800 Pure hypercholesterolemia, unspecified: Secondary | ICD-10-CM

## 2022-07-08 LAB — COMPREHENSIVE METABOLIC PANEL
ALT: 9 U/L (ref 0–53)
AST: 16 U/L (ref 0–37)
Albumin: 4.5 g/dL (ref 3.5–5.2)
Alkaline Phosphatase: 80 U/L (ref 39–117)
BUN: 9 mg/dL (ref 6–23)
CO2: 30 mEq/L (ref 19–32)
Calcium: 9.7 mg/dL (ref 8.4–10.5)
Chloride: 100 mEq/L (ref 96–112)
Creatinine, Ser: 0.82 mg/dL (ref 0.40–1.50)
GFR: 102.54 mL/min (ref >60.00)
Glucose, Bld: 93 mg/dL (ref 70–99)
Potassium: 4.5 mEq/L (ref 3.5–5.1)
Sodium: 136 mEq/L (ref 135–145)
Total Bilirubin: 0.6 mg/dL (ref 0.2–1.2)
Total Protein: 7.9 g/dL (ref 6.0–8.3)

## 2022-07-08 LAB — LIPID PANEL
Cholesterol: 207 mg/dL — ABNORMAL HIGH (ref 0–200)
HDL: 48.2 mg/dL (ref >39.00)
LDL Cholesterol: 135 mg/dL — ABNORMAL HIGH (ref 0–99)
NonHDL: 158.54
Total CHOL/HDL Ratio: 4
Triglycerides: 116 mg/dL (ref 0.0–149.0)
VLDL: 23.2 mg/dL (ref 0.0–40.0)

## 2022-07-08 LAB — VITAMIN D 25 HYDROXY (VIT D DEFICIENCY, FRACTURES): VITD: 41.79 ng/mL (ref 30.00–100.00)

## 2022-07-08 MED ORDER — AMLODIPINE BESY-BENAZEPRIL HCL 10-20 MG PO CAPS: 1.0000 | ORAL_CAPSULE | Freq: Every day | ORAL | 3 refills | Status: DC

## 2022-07-08 NOTE — Progress Notes (Signed)
Subjective:  Patient ID: Symon Norwood, male    DOB: 1970/11/09  Age: 51 y.o. MRN: [id number]  CC:  Chief Complaint  Patient presents with   Medication Refill    Pt states all is well    HPI Jerauld Bostwick presents for  Medication refill, prior PCP Maximiano Coss.  Last office visit September 2022.  Prior note, labs reviewed. 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   Hypertension: Treated with amlodipine benazepril 10/20 mg daily. No new side effects. Rare ankle swelling. No pain/debility. Multiple prior orthopaedic surgeries, injuries.  Changed eating habits in past, avoiding fast and frozen food. Healthier eating.  Home readings: 136/84. Sometimes lower,  highest 140/90  BP Readings from Last 3 Encounters:  07/08/22 124/80  06/30/21 140/86  12/23/20 118/77   Lab Results  Component Value Date   CREATININE 0.90 06/30/2021   Hyperlipidemia: Mild elevated LDL in September 2022.  Borderline ASCVD risk score prior No known FH of early cardiac disease.  Fasting today.  The 10-year ASCVD risk score (Arnett DK, et al., 2019) is: 8.1%   Values used to calculate the score:     Age: 11 years     Sex: Male     Is Non-Hispanic African American: No     Diabetic: No     Tobacco smoker: Yes     Systolic Blood Pressure: 884 mmHg     Is BP treated: Yes     HDL Cholesterol: 47.8 mg/dL     Total Cholesterol: 187 mg/dL  Lab Results  Component Value Date   CHOL 187 06/30/2021   HDL 47.80 06/30/2021   LDLCALC 117 (H) 06/30/2021   TRIG 109.0 06/30/2021   CHOLHDL 4 06/30/2021   Lab Results  Component Value Date   ALT 8 06/30/2021   AST 14 06/30/2021   ALKPHOS 82 06/30/2021   BILITOT 0.6 06/30/2021    History of vitamin D deficiency 13.9 in January 2021, normal at 32.7 on 01/02/2020. No current supplements.   HM:  Flu vaccine: declines.  Declines covid vaccine.  Screening options with colonoscopy versus Cologuard discussed. No FH of colon CA, or personal  hx polyps. .Discussed timing of repeat testing intervals if normal, as well as potential need for diagnostic Colonoscopy if positive Cologuard. Understanding expressed, and chose Cologuard.  HIV screen today.   History Patient Active Problem List   Diagnosis Date Noted   Palpitations 11/12/2019   Essential hypertension 11/12/2019   Paresthesia and pain of both upper extremities 11/12/2019   No past medical history on file. No past surgical history on file. No Known Allergies Prior to Admission medications   Medication Sig Start Date End Date Taking? Authorizing Provider  amLODipine-benazepril (LOTREL) 10-20 MG capsule Take 1 capsule by mouth daily. 06/30/21  Yes Maximiano Coss, NP   Social History   Socioeconomic History   Marital status: Single    Spouse name: Not on file   Number of children: Not on file   Years of education: Not on file   Highest education level: Not on file  Occupational History   Not on file  Tobacco Use   Smoking status: Every Day   Smokeless tobacco: Never  Substance and Sexual Activity   Alcohol use: Yes    Alcohol/week: 0.0 standard drinks of alcohol   Drug use: Not Currently   Sexual activity: Not on file  Other Topics Concern   Not on file  Social History Narrative   Not on file  Social Determinants of Health   Financial Resource Strain: Not on file  Food Insecurity: Not on file  Transportation Needs: Not on file  Physical Activity: Not on file  Stress: Not on file  Social Connections: Not on file  Intimate Partner Violence: Not on file    Review of Systems  Constitutional:  Negative for fatigue and unexpected weight change.  Eyes:  Negative for visual disturbance.  Respiratory:  Negative for cough, chest tightness and shortness of breath.   Cardiovascular:  Negative for chest pain, palpitations and leg swelling.  Gastrointestinal:  Negative for abdominal pain and blood in stool.  Neurological:  Negative for dizziness,  light-headedness and headaches.     Objective:   Vitals:   07/08/22 0834  BP: 124/80  Pulse: 73  Temp: 97.8 F (36.6 C)  SpO2: 100%  Weight: 166 lb 12.8 oz (75.7 kg)  Height: 6' (1.829 m)     Physical Exam Vitals reviewed.  Constitutional:      Appearance: He is well-developed.  HENT:     Head: Normocephalic and atraumatic.  Neck:     Vascular: No carotid bruit or JVD.  Cardiovascular:     Rate and Rhythm: Normal rate and regular rhythm.     Heart sounds: Normal heart sounds. No murmur heard. Pulmonary:     Effort: Pulmonary effort is normal.     Breath sounds: Normal breath sounds. No rales.  Musculoskeletal:     Right lower leg: No edema.     Left lower leg: No edema.  Skin:    General: Skin is warm and dry.  Neurological:     Mental Status: He is alert and oriented to person, place, and time.  Psychiatric:        Mood and Affect: Mood normal.        Assessment & Plan:  Dysen Heintzelman is a 51 y.o. male . Essential hypertension, benign - Plan: amLODipine-benazepril (LOTREL) 10-20 MG capsule  -  Stable, tolerating current regimen. Medications refilled. Labs pending as above.   Vitamin D deficiency - Plan: Vitamin D (25 hydroxy  -Check labs with repletion as necessary, option of over-the-counter supplement if borderline.  Elevated LDL cholesterol level - Plan: Comprehensive metabolic panel, Lipid panel  -Borderline ASCVD risk score, diet and exercise have improved as above.  Will defer meds for now.  Check labs.  Follow-up in 1 year for physical if stable.  Screen for colon cancer - Plan: Cologuard  -Options discussed above, chose Cologuard, ordered.  Encounter for screening for HIV - Plan: HIV Antibody (routine testing w rflx)   Meds ordered this encounter  Medications   amLODipine-benazepril (LOTREL) 10-20 MG capsule    Sig: Take 1 capsule by mouth daily.    Dispense:  90 capsule    Refill:  3   Patient Instructions  Thanks for coming in today.   Blood pressure looks okay, no med changes for now.  Keep up the good work with diet and exercise.  If any concerns on the vitamin D level or other blood tests I will let you know.  Cholesterol level was borderline, I do not recommend any new medications for now as long as that result is stable.   Cologuard colon cancer screening test was ordered.  Please let me know if there are questions.  Follow-up in 1 year but I am happy to see you sooner if needed.  Take care.     Signed,   Merri Ray, MD [facility] 07/08/22 9:31 AM

## 2022-07-08 NOTE — Patient Instructions (Signed)
Thanks for coming in today.  Blood pressure looks okay, no med changes for now.  Keep up the good work with diet and exercise.  If any concerns on the vitamin D level or other blood tests I will let you know.  Cholesterol level was borderline, I do not recommend any new medications for now as long as that result is stable.   Cologuard colon cancer screening test was ordered.  Please let me know if there are questions.  Follow-up in 1 year but I am happy to see you sooner if needed.  Take care.

## 2022-07-09 LAB — HIV ANTIBODY (ROUTINE TESTING W REFLEX): HIV 1&2 Ab, 4th Generation: NONREACTIVE

## 2023-06-02 ENCOUNTER — Encounter (INDEPENDENT_AMBULATORY_CARE_PROVIDER_SITE_OTHER): Payer: Self-pay

## 2023-06-23 ENCOUNTER — Other Ambulatory Visit: Payer: Self-pay | Admitting: Family Medicine

## 2023-06-23 DIAGNOSIS — I10 Essential (primary) hypertension: Secondary | ICD-10-CM

## 2023-07-11 ENCOUNTER — Encounter: Payer: Self-pay | Admitting: Family Medicine

## 2023-07-11 ENCOUNTER — Ambulatory Visit: Payer: BC Managed Care – PPO | Admitting: Family Medicine

## 2023-07-11 VITALS — BP 130/84 | HR 76 | Temp 97.9°F | Ht 71.5 in | Wt 163.4 lb

## 2023-07-11 DIAGNOSIS — Z130 Encounter for screening for diseases of the blood and blood-forming organs and certain disorders involving the immune mechanism: Secondary | ICD-10-CM | POA: Diagnosis not present

## 2023-07-11 DIAGNOSIS — Z131 Encounter for screening for diabetes mellitus: Secondary | ICD-10-CM | POA: Diagnosis not present

## 2023-07-11 DIAGNOSIS — Z0000 Encounter for general adult medical examination without abnormal findings: Secondary | ICD-10-CM | POA: Diagnosis not present

## 2023-07-11 DIAGNOSIS — Z8639 Personal history of other endocrine, nutritional and metabolic disease: Secondary | ICD-10-CM

## 2023-07-11 DIAGNOSIS — I10 Essential (primary) hypertension: Secondary | ICD-10-CM

## 2023-07-11 DIAGNOSIS — E785 Hyperlipidemia, unspecified: Secondary | ICD-10-CM | POA: Diagnosis not present

## 2023-07-11 DIAGNOSIS — Z1211 Encounter for screening for malignant neoplasm of colon: Secondary | ICD-10-CM

## 2023-07-11 DIAGNOSIS — F17210 Nicotine dependence, cigarettes, uncomplicated: Secondary | ICD-10-CM | POA: Diagnosis not present

## 2023-07-11 DIAGNOSIS — E559 Vitamin D deficiency, unspecified: Secondary | ICD-10-CM | POA: Diagnosis not present

## 2023-07-11 LAB — COMPREHENSIVE METABOLIC PANEL
ALT: 9 U/L (ref 0–53)
AST: 20 U/L (ref 0–37)
Albumin: 4.6 g/dL (ref 3.5–5.2)
Alkaline Phosphatase: 71 U/L (ref 39–117)
BUN: 7 mg/dL (ref 6–23)
CO2: 27 mEq/L (ref 19–32)
Calcium: 9.7 mg/dL (ref 8.4–10.5)
Chloride: 101 mEq/L (ref 96–112)
Creatinine, Ser: 0.83 mg/dL (ref 0.40–1.50)
GFR: 101.45 mL/min (ref >60.00)
Glucose, Bld: 109 mg/dL — ABNORMAL HIGH (ref 70–99)
Potassium: 4.4 mEq/L (ref 3.5–5.1)
Sodium: 137 mEq/L (ref 135–145)
Total Bilirubin: 0.7 mg/dL (ref 0.2–1.2)
Total Protein: 7.8 g/dL (ref 6.0–8.3)

## 2023-07-11 LAB — CBC WITH DIFFERENTIAL/PLATELET
Basophils Absolute: 0 10*3/uL (ref 0.0–0.1)
Basophils Relative: 1 % (ref 0.0–3.0)
Eosinophils Absolute: 0 10*3/uL (ref 0.0–0.7)
Eosinophils Relative: 1 % (ref 0.0–5.0)
HCT: 52.6 % — ABNORMAL HIGH (ref 39.0–52.0)
Hemoglobin: 17.6 g/dL — ABNORMAL HIGH (ref 13.0–17.0)
Lymphocytes Relative: 22.3 % (ref 12.0–46.0)
Lymphs Abs: 1.1 10*3/uL (ref 0.7–4.0)
MCHC: 33.4 g/dL (ref 30.0–36.0)
MCV: 102.3 fl — ABNORMAL HIGH (ref 78.0–100.0)
Monocytes Absolute: 0.3 10*3/uL (ref 0.1–1.0)
Monocytes Relative: 6.9 % (ref 3.0–12.0)
Neutro Abs: 3.3 10*3/uL (ref 1.4–7.7)
Neutrophils Relative %: 68.8 % (ref 43.0–77.0)
Platelets: 149 10*3/uL — ABNORMAL LOW (ref 150.0–400.0)
RBC: 5.14 Mil/uL (ref 4.22–5.81)
RDW: 12.8 % (ref 11.5–15.5)
WBC: 4.7 10*3/uL (ref 4.0–10.5)

## 2023-07-11 LAB — LIPID PANEL
Cholesterol: 200 mg/dL (ref 0–200)
HDL: 55.9 mg/dL (ref >39.00)
LDL Cholesterol: 123 mg/dL — ABNORMAL HIGH (ref 0–99)
NonHDL: 144.37
Total CHOL/HDL Ratio: 4
Triglycerides: 105 mg/dL (ref 0.0–149.0)
VLDL: 21 mg/dL (ref 0.0–40.0)

## 2023-07-11 LAB — VITAMIN D 25 HYDROXY (VIT D DEFICIENCY, FRACTURES): VITD: 37.65 ng/mL (ref 30.00–100.00)

## 2023-07-11 LAB — TSH: TSH: 0.54 u[IU]/mL (ref 0.35–5.50)

## 2023-07-11 LAB — HEMOGLOBIN A1C: Hgb A1c MFr Bld: 5.7 % (ref 4.6–6.5)

## 2023-07-11 NOTE — Progress Notes (Unsigned)
+  Subjective:  Patient ID: Samuel Kim, male    DOB: 1970-12-23  Age: 52 y.o. MRN: [id number]  CC:  Chief Complaint  Patient presents with   Annual Exam    HPI Conley Deman presents for Annual Exam PCP, me No other medical providers at this time. No health changes since last visit.  Busy with work. 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   Hypertension: Treated with amlodipine/benazepril 10/20 mg daily.  Rare ankle swelling in the past.  No other new side effects.  Healthier eating when discussed last September. Usually sleeping well, off last night, plans follow up if returns.  Home readings: 124/84 today.  No added salt to food. Avoiding frozen food. Some beer at times. 4-5/day on 4 days per week.  Denies addiction or difficulty cutting back.  No med side effects.  BP Readings from Last 3 Encounters:  07/11/23 130/84  07/08/22 124/80  06/30/21 140/86   Lab Results  Component Value Date   CREATININE 0.82 07/08/2022   Nicotine addiction: 1 ppd for past 20 years. No quit attempts but cut back to 1/2 pack for a few months in past. Wants to try cutting back first. Denies meds at this time. 3 min discussion regarding health risks, cessation options.   Hyperlipidemia: Mild elevated LDL in the past but no family history of cardiac disease. Borderline ascvd risk score.  The 10-year ASCVD risk score (Arnett DK, et al., 2019) is: 10.6%   Values used to calculate the score:     Age: 53 years     Sex: Male     Is Non-Hispanic African American: No     Diabetic: No     Tobacco smoker: Yes     Systolic Blood Pressure: 130 mmHg     Is BP treated: Yes     HDL Cholesterol: 48.2 mg/dL     Total Cholesterol: 207 mg/dL  Lab Results  Component Value Date   CHOL 207 (H) 07/08/2022   HDL 48.20 07/08/2022   LDLCALC 135 (H) 07/08/2022   TRIG 116.0 07/08/2022   CHOLHDL 4 07/08/2022   Lab Results  Component Value Date   ALT 9 07/08/2022   AST 16 07/08/2022   ALKPHOS 80 07/08/2022    BILITOT 0.6 07/08/2022    Vitamin D deficiency Low at 13.9 in January 2021, then improved to 32.7 few months later.  Off supplements at his last visit in September 2023.  Stable at that time. Last vitamin D Lab Results  Component Value Date   VD25OH 41.79 07/08/2022  No supplements.       07/11/2023    9:41 AM 07/08/2022    8:32 AM 06/30/2021   10:25 AM 12/23/2020   11:21 AM 10/27/2020   11:03 AM  Depression screen PHQ 2/9  Decreased Interest 0 0 0 0 0  Down, Depressed, Hopeless 0 0 0 0 0  PHQ - 2 Score 0 0 0 0 0  Altered sleeping 0 0 0    Tired, decreased energy 0 0 0    Change in appetite 0 0 0    Feeling bad or failure about yourself  0 0 0    Trouble concentrating 0 0 0    Moving slowly or fidgety/restless 0 0 0    Suicidal thoughts 0 0 0    PHQ-9 Score 0 0 0    Difficult doing work/chores Not difficult at all  Not difficult at all      Health Maintenance  Topic Date Due   DTaP/Tdap/Td (1 - Tdap) Never done   Colonoscopy  Never done   COVID-19 Vaccine (1 - 2023-24 season) Never done   Zoster Vaccines- Shingrix (1 of 2) 10/10/2023 (Originally 03/02/2022)   INFLUENZA VACCINE  01/16/2024 (Originally 05/19/2023)   Hepatitis C Screening  Completed   HIV Screening  Completed   HPV VACCINES  Aged Out  Cologuard ordered last year. Has not done.  Prostate: does noi have family history of prostate cancer The natural history of prostate cancer and ongoing controversy regarding screening and potential treatment outcomes of prostate cancer has been discussed with the patient. The meaning of a false positive PSA and a false negative PSA has been discussed. He indicates understanding of the limitations of this screening test and wishes NOT to proceed with screening PSA testing. No results found for: "PSA1", "PSA" Declines lung cancer screening.  Hx of testicular CA.    There is no immunization history on file for this patient. Flu vaccine, COVID-vaccine declined.  Shingles vaccine  declined.  Tdap declined.  No results found. Optho once a year  Dental:every 6 months.   Alcohol:as above.   Tobacco: 1ppd as above.   Exercise: active with work, Naval architect.    History Patient Active Problem List   Diagnosis Date Noted   Palpitations 11/12/2019   Essential hypertension 11/12/2019   Paresthesia and pain of both upper extremities 11/12/2019   No past medical history on file. No past surgical history on file. No Known Allergies Prior to Admission medications   Medication Sig Start Date End Date Taking? Authorizing Provider  amLODipine-benazepril (LOTREL) 10-20 MG capsule Take 1 capsule by mouth once daily 06/23/23   Shade Flood, MD   Social History   Socioeconomic History   Marital status: Single    Spouse name: Not on file   Number of children: Not on file   Years of education: Not on file   Highest education level: Not on file  Occupational History   Not on file  Tobacco Use   Smoking status: Every Day   Smokeless tobacco: Never  Substance and Sexual Activity   Alcohol use: Yes    Alcohol/week: 0.0 standard drinks of alcohol   Drug use: Not Currently   Sexual activity: Not on file  Other Topics Concern   Not on file  Social History Narrative   Not on file   Social Determinants of Health   Financial Resource Strain: Not on file  Food Insecurity: Not on file  Transportation Needs: Not on file  Physical Activity: Not on file  Stress: Not on file  Social Connections: Not on file  Intimate Partner Violence: Not on file    Review of Systems  Constitutional:  Negative for fatigue and unexpected weight change.  Eyes:  Negative for visual disturbance.  Respiratory:  Negative for cough, chest tightness and shortness of breath.   Cardiovascular:  Negative for chest pain, palpitations and leg swelling.  Gastrointestinal:  Negative for abdominal pain and blood in stool.  Neurological:  Negative for dizziness, light-headedness and  headaches.   13 point review of systems per patient health survey noted.  Negative other than as indicated above or in HPI.    Objective:   Vitals:   07/11/23 0928  BP: 130/84  Pulse: 76  Temp: 97.9 F (36.6 C)  TempSrc: Oral  SpO2: 98%  Weight: 163 lb 6.4 oz (74.1 kg)  Height: 5' 11.5" (1.816 m)   {Vitals History (  Optional):23777}  Physical Exam Vitals reviewed.  Constitutional:      Appearance: He is well-developed.  HENT:     Head: Normocephalic and atraumatic.     Right Ear: External ear normal.     Left Ear: External ear normal.  Eyes:     Conjunctiva/sclera: Conjunctivae normal.     Pupils: Pupils are equal, round, and reactive to light.  Neck:     Thyroid: No thyromegaly.  Cardiovascular:     Rate and Rhythm: Normal rate and regular rhythm.     Heart sounds: Normal heart sounds.  Pulmonary:     Effort: Pulmonary effort is normal. No respiratory distress.     Breath sounds: Normal breath sounds. No wheezing.  Abdominal:     General: There is no distension.     Palpations: Abdomen is soft.     Tenderness: There is no abdominal tenderness.  Musculoskeletal:        General: No tenderness. Normal range of motion.     Cervical back: Normal range of motion and neck supple.  Lymphadenopathy:     Cervical: No cervical adenopathy.  Skin:    General: Skin is warm and dry.  Neurological:     Mental Status: He is alert and oriented to person, place, and time.     Deep Tendon Reflexes: Reflexes are normal and symmetric.  Psychiatric:        Behavior: Behavior normal.        Assessment & Plan:  Cairee Lumbard is a 52 y.o. male . Annual physical exam  Essential hypertension, benign - Plan: Comprehensive metabolic panel, TSH  Vitamin D deficiency  Screen for colon cancer - Plan: Cologuard  Cigarette nicotine dependence without complication  Screening for diabetes mellitus - Plan: HgB A1c  Hyperlipidemia, unspecified hyperlipidemia type - Plan: Lipid  panel, Comprehensive metabolic panel  Screening, anemia, deficiency, iron - Plan: CBC with Differential/Platelet  History of vitamin D deficiency - Plan: Vitamin D, 25-hydroxy   No orders of the defined types were placed in this encounter.  Patient Instructions  Try cutting back on beer - goal of no more than 1-2 per day. If blood pressure remains over 130/80 with this change let me know.   See info below on quitting smoking. Let me know if I can help.   Managing the Challenge of Quitting Smoking Quitting smoking is a physical and mental challenge. You may have cravings, withdrawal symptoms, and temptation to smoke. Before quitting, work with your health care provider to make a plan that can help you manage quitting. Making a plan before you quit may keep you from smoking when you have the urge to smoke while trying to quit. How to manage lifestyle changes Managing stress Stress can make you want to smoke, and wanting to smoke may cause stress. It is important to find ways to manage your stress. You could try some of the following: Practice relaxation techniques. Breathe slowly and deeply, in through your nose and out through your mouth. Listen to music. Soak in a bath or take a shower. Imagine a peaceful place or vacation. Get some support. Talk with family or friends about your stress. Join a support group. Talk with a counselor or therapist. Get some physical activity. Go for a walk, run, or bike ride. Play a favorite sport. Practice yoga.  Medicines Talk with your health care provider about medicines that might help you deal with cravings and make quitting easier for you. Relationships Social situations can be  difficult when you are quitting smoking. To manage this, you can: Avoid parties and other social situations where people might be smoking. Avoid alcohol. Leave right away if you have the urge to smoke. Explain to your family and friends that you are quitting smoking.  Ask for support and let them know you might be a bit grumpy. Plan activities where smoking is not an option. General instructions Be aware that many people gain weight after they quit smoking. However, not everyone does. To keep from gaining weight, have a plan in place before you quit, and stick to the plan after you quit. Your plan should include: Eating healthy snacks. When you have a craving, it may help to: Eat popcorn, or try carrots, celery, or other cut vegetables. Chew sugar-free gum. Changing how you eat. Eat small portion sizes at meals. Eat 4-6 small meals throughout the day instead of 1-2 large meals a day. Be mindful when you eat. You should avoid watching television or doing other things that might distract you as you eat. Exercising regularly. Make time to exercise each day. If you do not have time for a long workout, do short bouts of exercise for 5-10 minutes several times a day. Do some form of strengthening exercise, such as weight lifting. Do some exercise that gets your heart beating and causes you to breathe deeply, such as walking fast, running, swimming, or biking. This is very important. Drinking plenty of water or other low-calorie or no-calorie drinks. Drink enough fluid to keep your urine pale yellow.  How to recognize withdrawal symptoms Your body and mind may experience discomfort as you try to get used to not having nicotine in your system. These effects are called withdrawal symptoms. They may include: Feeling hungrier than normal. Having trouble concentrating. Feeling irritable or restless. Having trouble sleeping. Feeling depressed. Craving a cigarette. These symptoms may surprise you, but they are normal to have when quitting smoking. To manage withdrawal symptoms: Avoid places, people, and activities that trigger your cravings. Remember why you want to quit. Get plenty of sleep. Avoid coffee and other drinks that contain caffeine. These may worsen  some of your symptoms. How to manage cravings Come up with a plan for how to deal with your cravings. The plan should include the following: A definition of the specific situation you want to deal with. An activity or action you will take to replace smoking. A clear idea for how this action will help. The name of someone who could help you with this. Cravings usually last for 5-10 minutes. Consider taking the following actions to help you with your plan to deal with cravings: Keep your mouth busy. Chew sugar-free gum. Suck on hard candies or a straw. Brush your teeth. Keep your hands and body busy. Change to a different activity right away. Squeeze or play with a ball. Do an activity or a hobby, such as making bead jewelry, practicing needlepoint, or working with wood. Mix up your normal routine. Take a short exercise break. Go for a quick walk, or run up and down stairs. Focus on doing something kind or helpful for someone else. Call a friend or family member to talk during a craving. Join a support group. Contact a quitline. Where to find support To get help or find a support group: Call the National Cancer Institute's Smoking Quitline: 1-800-QUIT-NOW 919-648-5842) Text QUIT to SmokefreeTXT: 413244 Where to find more information Visit these websites to find more information on quitting smoking: [place]. Department of  Health and Human Services: www.smokefree.gov American Lung Association: www.freedomfromsmoking.org Centers for Disease Control and Prevention (CDC): FootballExhibition.com.br American Heart Association: www.heart.org Contact a health care provider if: You want to change your plan for quitting. The medicines you are taking are not helping. Your eating feels out of control or you cannot sleep. You feel depressed or become very anxious. Summary Quitting smoking is a physical and mental challenge. You will face cravings, withdrawal symptoms, and temptation to smoke again. Preparation can  help you as you go through these challenges. Try different techniques to manage stress, handle social situations, and prevent weight gain. You can deal with cravings by keeping your mouth busy (such as by chewing gum), keeping your hands and body busy, calling family or friends, or contacting a quitline for people who want to quit smoking. You can deal with withdrawal symptoms by avoiding places where people smoke, getting plenty of rest, and avoiding drinks that contain caffeine. This information is not intended to replace advice given to you by your health care provider. Make sure you discuss any questions you have with your health care provider. Document Revised: 09/25/2021 Document Reviewed: 09/25/2021 Elsevier Patient Education  2024 Elsevier Inc.   Steps to Quit Smoking Smoking tobacco is the leading cause of preventable death. It can affect almost every organ in the body. Smoking puts you and those around you at risk for developing many serious chronic diseases. Quitting smoking can be very challenging. Do not get discouraged if you are not successful the first time. Some people need to make many attempts to quit before they achieve long-term success. Do your best to stick to your quit plan, and talk with your health care provider if you have any questions or concerns. How do I get ready to quit? When you decide to quit smoking, create a plan to help you succeed. Before you quit: Pick a date to quit. Set a date within the next 2 weeks to give you time to prepare. Write down the reasons why you are quitting. Keep this list in places where you will see it often. Tell your family, friends, and co-workers that you are quitting. Support from people you are close to can make quitting easier. Talk with your health care provider about your options for quitting smoking. Find out what treatment options are covered by your health insurance. Identify people, places, things, and activities that make  you want to smoke (triggers). Avoid them. What first steps can I take to quit smoking? Throw away all cigarettes at home, at work, and in your car. Throw away smoking accessories, such as Set designer. Clean your car. Make sure to empty the ashtray. Clean your home, including curtains and carpets. What strategies can I use to quit smoking? Talk with your health care provider about combining strategies, such as taking medicines while you are also receiving in-person counseling. Using these two strategies together makes you more likely to succeed in quitting than if you used either strategy on its own. If you are pregnant or breastfeeding, talk with your health care provider about finding counseling or other support strategies to quit smoking. Do not take medicine to help you quit smoking unless your health care provider tells you to. Quit right away Quit smoking completely, instead of gradually reducing how much you smoke over a period of time. Stopping smoking right away may be more successful than gradually quitting. Attend in-person counseling to help you build problem-solving skills. You are more likely to succeed in  quitting if you attend counseling sessions regularly. Even short sessions of 10 minutes can be effective. Take medicine You may take medicines to help you quit smoking. Some medicines require a prescription. You can also purchase over-the-counter medicines. Medicines may have nicotine in them to replace the nicotine in cigarettes. Medicines may: Help to stop cravings. Help to relieve withdrawal symptoms. Your health care provider may recommend: Nicotine patches, gum, or lozenges. Nicotine inhalers or sprays. Non-nicotine medicine that you take by mouth. Find resources Find resources and support systems that can help you quit smoking and remain smoke-free after you quit. These resources are most helpful when you use them often. They include: Online chats with a  Veterinary surgeon. Telephone quitlines. Printed Materials engineer. Support groups or group counseling. Text messaging programs. Mobile phone apps or applications. Use apps that can help you stick to your quit plan by providing reminders, tips, and encouragement. Examples of free services include Quit Guide from the CDC and smokefree.gov  What can I do to make it easier to quit?  Reach out to your family and friends for support and encouragement. Call telephone quitlines, such as 1-800-QUIT-NOW, reach out to support groups, or work with a counselor for support. Ask people who smoke to avoid smoking around you. Avoid places that trigger you to smoke, such as bars, parties, or smoke-break areas at work. Spend time with people who do not smoke. Lessen the stress in your life. Stress can be a smoking trigger for some people. To lessen stress, try: Exercising regularly. Doing deep-breathing exercises. Doing yoga. Meditating. What benefits will I see if I quit smoking? Over time, you should start to see positive results, such as: Improved sense of smell and taste. Decreased coughing and sore throat. Slower heart rate. Lower blood pressure. Clearer and healthier skin. The ability to breathe more easily. Fewer sick days. Summary Quitting smoking can be very challenging. Do not get discouraged if you are not successful the first time. Some people need to make many attempts to quit before they achieve long-term success. When you decide to quit smoking, create a plan to help you succeed. Quit smoking right away, not slowly over a period of time. Find resources and support systems that can help you quit smoking and remain smoke-free after you quit. This information is not intended to replace advice given to you by your health care provider. Make sure you discuss any questions you have with your health care provider. Document Revised: 09/25/2021 Document Reviewed: 09/25/2021 Elsevier Patient Education   2024 [employer].   Preventive Care 15-5 Years Old, Male Preventive care refers to lifestyle choices and visits with your health care provider that can promote health and wellness. Preventive care visits are also called wellness exams. What can I expect for my preventive care visit? Counseling During your preventive care visit, your health care provider may ask about your: Medical history, including: Past medical problems. Family medical history. Current health, including: Emotional well-being. Home life and relationship well-being. Sexual activity. Lifestyle, including: Alcohol, nicotine or tobacco, and drug use. Access to firearms. Diet, exercise, and sleep habits. Safety issues such as seatbelt and bike helmet use. Sunscreen use. Work and work Astronomer. Physical exam Your health care provider will check your: Height and weight. These may be used to calculate your BMI (body mass index). BMI is a measurement that tells if you are at a healthy weight. Waist circumference. This measures the distance around your waistline. This measurement also tells if you are at a  healthy weight and may help predict your risk of certain diseases, such as type 2 diabetes and high blood pressure. Heart rate and blood pressure. Body temperature. Skin for abnormal spots. What immunizations do I need?  Vaccines are usually given at various ages, according to a schedule. Your health care provider will recommend vaccines for you based on your age, medical history, and lifestyle or other factors, such as travel or where you work. What tests do I need? Screening Your health care provider may recommend screening tests for certain conditions. This may include: Lipid and cholesterol levels. Diabetes screening. This is done by checking your blood sugar (glucose) after you have not eaten for a while (fasting). Hepatitis B test. Hepatitis C test. HIV (human immunodeficiency virus) test. STI (sexually  transmitted infection) testing, if you are at risk. Lung cancer screening. Prostate cancer screening. Colorectal cancer screening. Talk with your health care provider about your test results, treatment options, and if necessary, the need for more tests. Follow these instructions at home: Eating and drinking  Eat a diet that includes fresh fruits and vegetables, whole grains, lean protein, and low-fat dairy products. Take vitamin and mineral supplements as recommended by your health care provider. Do not drink alcohol if your health care provider tells you not to drink. If you drink alcohol: Limit how much you have to 0-2 drinks a day. Know how much alcohol is in your drink. In the [place] one drink equals one 12 oz bottle of beer (355 mL), one 5 oz glass of wine (148 mL), or one 1 oz glass of hard liquor (44 mL). Lifestyle Brush your teeth every morning and night with fluoride toothpaste. Floss one time each day. Exercise for at least 30 minutes 5 or more days each week. Do not use any products that contain nicotine or tobacco. These products include cigarettes, chewing tobacco, and vaping devices, such as e-cigarettes. If you need help quitting, ask your health care provider. Do not use drugs. If you are sexually active, practice safe sex. Use a condom or other form of protection to prevent STIs. Take aspirin only as told by your health care provider. Make sure that you understand how much to take and what form to take. Work with your health care provider to find out whether it is safe and beneficial for you to take aspirin daily. Find healthy ways to manage stress, such as: Meditation, yoga, or listening to music. Journaling. Talking to a trusted person. Spending time with friends and family. Minimize exposure to UV radiation to reduce your risk of skin cancer. Safety Always wear your seat belt while driving or riding in a vehicle. Do not drive: If you have been drinking alcohol. Do  not ride with someone who has been drinking. When you are tired or distracted. While texting. If you have been using any mind-altering substances or drugs. Wear a helmet and other protective equipment during sports activities. If you have firearms in your house, make sure you follow all gun safety procedures. What's next? Go to your health care provider once a year for an annual wellness visit. Ask your health care provider how often you should have your eyes and teeth checked. Stay up to date on all vaccines. This information is not intended to replace advice given to you by your health care provider. Make sure you discuss any questions you have with your health care provider. Document Revised: 04/01/2021 Document Reviewed: 04/01/2021 Elsevier Patient Education  2024 [employer].  Signed,   Meredith Staggers, MD [facility], [facility] 07/11/23 9:58 AM

## 2023-07-11 NOTE — Patient Instructions (Addendum)
Try cutting back on beer - goal of no more than 1-2 per day. If blood pressure remains over 130/80 with this change let me know.   See info below on quitting smoking. Let me know if I can help.   I will order the Cologuard colon cancer screening test again.  Please the box in the bathroom to help remember to collect specimen, and try to have that completed in the next 2 weeks.  I would recommend lung cancer screening with history of smoking, let me know if I can order that test.  Please let me know if there are any questions from today's visit and take care.  Managing the Challenge of Quitting Smoking Quitting smoking is a physical and mental challenge. You may have cravings, withdrawal symptoms, and temptation to smoke. Before quitting, work with your health care provider to make a plan that can help you manage quitting. Making a plan before you quit may keep you from smoking when you have the urge to smoke while trying to quit. How to manage lifestyle changes Managing stress Stress can make you want to smoke, and wanting to smoke may cause stress. It is important to find ways to manage your stress. You could try some of the following: Practice relaxation techniques. Breathe slowly and deeply, in through your nose and out through your mouth. Listen to music. Soak in a bath or take a shower. Imagine a peaceful place or vacation. Get some support. Talk with family or friends about your stress. Join a support group. Talk with a counselor or therapist. Get some physical activity. Go for a walk, run, or bike ride. Play a favorite sport. Practice yoga.  Medicines Talk with your health care provider about medicines that might help you deal with cravings and make quitting easier for you. Relationships Social situations can be difficult when you are quitting smoking. To manage this, you can: Avoid parties and other social situations where people might be smoking. Avoid alcohol. Leave right  away if you have the urge to smoke. Explain to your family and friends that you are quitting smoking. Ask for support and let them know you might be a bit grumpy. Plan activities where smoking is not an option. General instructions Be aware that many people gain weight after they quit smoking. However, not everyone does. To keep from gaining weight, have a plan in place before you quit, and stick to the plan after you quit. Your plan should include: Eating healthy snacks. When you have a craving, it may help to: Eat popcorn, or try carrots, celery, or other cut vegetables. Chew sugar-free gum. Changing how you eat. Eat small portion sizes at meals. Eat 4-6 small meals throughout the day instead of 1-2 large meals a day. Be mindful when you eat. You should avoid watching television or doing other things that might distract you as you eat. Exercising regularly. Make time to exercise each day. If you do not have time for a long workout, do short bouts of exercise for 5-10 minutes several times a day. Do some form of strengthening exercise, such as weight lifting. Do some exercise that gets your heart beating and causes you to breathe deeply, such as walking fast, running, swimming, or biking. This is very important. Drinking plenty of water or other low-calorie or no-calorie drinks. Drink enough fluid to keep your urine pale yellow.  How to recognize withdrawal symptoms Your body and mind may experience discomfort as you try to get used to  not having nicotine in your system. These effects are called withdrawal symptoms. They may include: Feeling hungrier than normal. Having trouble concentrating. Feeling irritable or restless. Having trouble sleeping. Feeling depressed. Craving a cigarette. These symptoms may surprise you, but they are normal to have when quitting smoking. To manage withdrawal symptoms: Avoid places, people, and activities that trigger your cravings. Remember why you want  to quit. Get plenty of sleep. Avoid coffee and other drinks that contain caffeine. These may worsen some of your symptoms. How to manage cravings Come up with a plan for how to deal with your cravings. The plan should include the following: A definition of the specific situation you want to deal with. An activity or action you will take to replace smoking. A clear idea for how this action will help. The name of someone who could help you with this. Cravings usually last for 5-10 minutes. Consider taking the following actions to help you with your plan to deal with cravings: Keep your mouth busy. Chew sugar-free gum. Suck on hard candies or a straw. Brush your teeth. Keep your hands and body busy. Change to a different activity right away. Squeeze or play with a ball. Do an activity or a hobby, such as making bead jewelry, practicing needlepoint, or working with wood. Mix up your normal routine. Take a short exercise break. Go for a quick walk, or run up and down stairs. Focus on doing something kind or helpful for someone else. Call a friend or family member to talk during a craving. Join a support group. Contact a quitline. Where to find support To get help or find a support group: Call the National Cancer Institute's Smoking Quitline: 1-800-QUIT-NOW 406-382-4586) Text QUIT to SmokefreeTXT: 454098 Where to find more information Visit these websites to find more information on quitting smoking: [place] Department of Health and Human Services: www.smokefree.gov American Lung Association: www.freedomfromsmoking.org Centers for Disease Control and Prevention (CDC): FootballExhibition.com.br American Heart Association: www.heart.org Contact a health care provider if: You want to change your plan for quitting. The medicines you are taking are not helping. Your eating feels out of control or you cannot sleep. You feel depressed or become very anxious. Summary Quitting smoking is a physical and mental  challenge. You will face cravings, withdrawal symptoms, and temptation to smoke again. Preparation can help you as you go through these challenges. Try different techniques to manage stress, handle social situations, and prevent weight gain. You can deal with cravings by keeping your mouth busy (such as by chewing gum), keeping your hands and body busy, calling family or friends, or contacting a quitline for people who want to quit smoking. You can deal with withdrawal symptoms by avoiding places where people smoke, getting plenty of rest, and avoiding drinks that contain caffeine. This information is not intended to replace advice given to you by your health care provider. Make sure you discuss any questions you have with your health care provider. Document Revised: 09/25/2021 Document Reviewed: 09/25/2021 Elsevier Patient Education  2024 [employer]   Steps to Quit Smoking Smoking tobacco is the leading cause of preventable death. It can affect almost every organ in the body. Smoking puts you and those around you at risk for developing many serious chronic diseases. Quitting smoking can be very challenging. Do not get discouraged if you are not successful the first time. Some people need to make many attempts to quit before they achieve long-term success. Do your best to stick to your quit plan,  and talk with your health care provider if you have any questions or concerns. How do I get ready to quit? When you decide to quit smoking, create a plan to help you succeed. Before you quit: Pick a date to quit. Set a date within the next 2 weeks to give you time to prepare. Write down the reasons why you are quitting. Keep this list in places where you will see it often. Tell your family, friends, and co-workers that you are quitting. Support from people you are close to can make quitting easier. Talk with your health care provider about your options for quitting smoking. Find out what treatment  options are covered by your health insurance. Identify people, places, things, and activities that make you want to smoke (triggers). Avoid them. What first steps can I take to quit smoking? Throw away all cigarettes at home, at work, and in your car. Throw away smoking accessories, such as Set designer. Clean your car. Make sure to empty the ashtray. Clean your home, including curtains and carpets. What strategies can I use to quit smoking? Talk with your health care provider about combining strategies, such as taking medicines while you are also receiving in-person counseling. Using these two strategies together makes you more likely to succeed in quitting than if you used either strategy on its own. If you are pregnant or breastfeeding, talk with your health care provider about finding counseling or other support strategies to quit smoking. Do not take medicine to help you quit smoking unless your health care provider tells you to. Quit right away Quit smoking completely, instead of gradually reducing how much you smoke over a period of time. Stopping smoking right away may be more successful than gradually quitting. Attend in-person counseling to help you build problem-solving skills. You are more likely to succeed in quitting if you attend counseling sessions regularly. Even short sessions of 10 minutes can be effective. Take medicine You may take medicines to help you quit smoking. Some medicines require a prescription. You can also purchase over-the-counter medicines. Medicines may have nicotine in them to replace the nicotine in cigarettes. Medicines may: Help to stop cravings. Help to relieve withdrawal symptoms. Your health care provider may recommend: Nicotine patches, gum, or lozenges. Nicotine inhalers or sprays. Non-nicotine medicine that you take by mouth. Find resources Find resources and support systems that can help you quit smoking and remain smoke-free after you  quit. These resources are most helpful when you use them often. They include: Online chats with a Veterinary surgeon. Telephone quitlines. Printed Materials engineer. Support groups or group counseling. Text messaging programs. Mobile phone apps or applications. Use apps that can help you stick to your quit plan by providing reminders, tips, and encouragement. Examples of free services include Quit Guide from the CDC and smokefree.gov  What can I do to make it easier to quit?  Reach out to your family and friends for support and encouragement. Call telephone quitlines, such as 1-800-QUIT-NOW, reach out to support groups, or work with a counselor for support. Ask people who smoke to avoid smoking around you. Avoid places that trigger you to smoke, such as bars, parties, or smoke-break areas at work. Spend time with people who do not smoke. Lessen the stress in your life. Stress can be a smoking trigger for some people. To lessen stress, try: Exercising regularly. Doing deep-breathing exercises. Doing yoga. Meditating. What benefits will I see if I quit smoking? Over time, you should start to see  positive results, such as: Improved sense of smell and taste. Decreased coughing and sore throat. Slower heart rate. Lower blood pressure. Clearer and healthier skin. The ability to breathe more easily. Fewer sick days. Summary Quitting smoking can be very challenging. Do not get discouraged if you are not successful the first time. Some people need to make many attempts to quit before they achieve long-term success. When you decide to quit smoking, create a plan to help you succeed. Quit smoking right away, not slowly over a period of time. Find resources and support systems that can help you quit smoking and remain smoke-free after you quit. This information is not intended to replace advice given to you by your health care provider. Make sure you discuss any questions you have with your health care  provider. Document Revised: 09/25/2021 Document Reviewed: 09/25/2021 Elsevier Patient Education  2024 [employer].   Preventive Care 71-39 Years Old, Male Preventive care refers to lifestyle choices and visits with your health care provider that can promote health and wellness. Preventive care visits are also called wellness exams. What can I expect for my preventive care visit? Counseling During your preventive care visit, your health care provider may ask about your: Medical history, including: Past medical problems. Family medical history. Current health, including: Emotional well-being. Home life and relationship well-being. Sexual activity. Lifestyle, including: Alcohol, nicotine or tobacco, and drug use. Access to firearms. Diet, exercise, and sleep habits. Safety issues such as seatbelt and bike helmet use. Sunscreen use. Work and work Astronomer. Physical exam Your health care provider will check your: Height and weight. These may be used to calculate your BMI (body mass index). BMI is a measurement that tells if you are at a healthy weight. Waist circumference. This measures the distance around your waistline. This measurement also tells if you are at a healthy weight and may help predict your risk of certain diseases, such as type 2 diabetes and high blood pressure. Heart rate and blood pressure. Body temperature. Skin for abnormal spots. What immunizations do I need?  Vaccines are usually given at various ages, according to a schedule. Your health care provider will recommend vaccines for you based on your age, medical history, and lifestyle or other factors, such as travel or where you work. What tests do I need? Screening Your health care provider may recommend screening tests for certain conditions. This may include: Lipid and cholesterol levels. Diabetes screening. This is done by checking your blood sugar (glucose) after you have not eaten for a while  (fasting). Hepatitis B test. Hepatitis C test. HIV (human immunodeficiency virus) test. STI (sexually transmitted infection) testing, if you are at risk. Lung cancer screening. Prostate cancer screening. Colorectal cancer screening. Talk with your health care provider about your test results, treatment options, and if necessary, the need for more tests. Follow these instructions at home: Eating and drinking  Eat a diet that includes fresh fruits and vegetables, whole grains, lean protein, and low-fat dairy products. Take vitamin and mineral supplements as recommended by your health care provider. Do not drink alcohol if your health care provider tells you not to drink. If you drink alcohol: Limit how much you have to 0-2 drinks a day. Know how much alcohol is in your drink. In the [place] one drink equals one 12 oz bottle of beer (355 mL), one 5 oz glass of wine (148 mL), or one 1 oz glass of hard liquor (44 mL). Lifestyle Brush your teeth every morning and night  with fluoride toothpaste. Floss one time each day. Exercise for at least 30 minutes 5 or more days each week. Do not use any products that contain nicotine or tobacco. These products include cigarettes, chewing tobacco, and vaping devices, such as e-cigarettes. If you need help quitting, ask your health care provider. Do not use drugs. If you are sexually active, practice safe sex. Use a condom or other form of protection to prevent STIs. Take aspirin only as told by your health care provider. Make sure that you understand how much to take and what form to take. Work with your health care provider to find out whether it is safe and beneficial for you to take aspirin daily. Find healthy ways to manage stress, such as: Meditation, yoga, or listening to music. Journaling. Talking to a trusted person. Spending time with friends and family. Minimize exposure to UV radiation to reduce your risk of skin cancer. Safety Always wear  your seat belt while driving or riding in a vehicle. Do not drive: If you have been drinking alcohol. Do not ride with someone who has been drinking. When you are tired or distracted. While texting. If you have been using any mind-altering substances or drugs. Wear a helmet and other protective equipment during sports activities. If you have firearms in your house, make sure you follow all gun safety procedures. What's next? Go to your health care provider once a year for an annual wellness visit. Ask your health care provider how often you should have your eyes and teeth checked. Stay up to date on all vaccines. This information is not intended to replace advice given to you by your health care provider. Make sure you discuss any questions you have with your health care provider. Document Revised: 04/01/2021 Document Reviewed: 04/01/2021 Elsevier Patient Education  2024 [employer].

## 2023-07-11 NOTE — Progress Notes (Unsigned)
+  Subjective:  Patient ID: Samuel Kim, male    DOB: [birth year]  Age: 52 y.o. MRN: [id number]  CC:  Chief Complaint  Patient presents with   Annual Exam    HPI [name] presents for Annual Exam PCP, me No other medical providers at this time. No health changes since last visit.  Busy with work. 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   Hypertension: Treated with amlodipine/benazepril 10/20 mg daily.  Rare ankle swelling in the past.  No other new side effects.  Healthier eating when discussed last September. Usually sleeping well, off last night, plans follow up if returns.  Home readings: 124/84 today.  No added salt to food. Avoiding frozen food. Some beer at times. 4-5/day on 4 days per week.  Denies addiction or difficulty cutting back.  No med side effects.  BP Readings from Last 3 Encounters:  07/11/23 130/84  07/08/22 124/80  06/30/21 140/86   Lab Results  Component Value Date   CREATININE 0.82 07/08/2022   Nicotine addiction: 1 ppd for past 20 years. No quit attempts but cut back to 1/2 pack for a few months in past. Wants to try cutting back first. Denies meds at this time. 3 min discussion regarding health risks, cessation options.   Hyperlipidemia: Mild elevated LDL in the past but no family history of cardiac disease. Borderline ascvd risk score.  The 10-year ASCVD risk score (Arnett DK, et al., 2019) is: 10.6%   Values used to calculate the score:     Age: 25 years     Sex: Male     Is Non-Hispanic African American: No     Diabetic: No     Tobacco smoker: Yes     Systolic Blood Pressure: 130 mmHg     Is BP treated: Yes     HDL Cholesterol: 48.2 mg/dL     Total Cholesterol: 207 mg/dL  Lab Results  Component Value Date   CHOL 207 (H) 07/08/2022   HDL 48.20 07/08/2022   LDLCALC 135 (H) 07/08/2022   TRIG 116.0 07/08/2022   CHOLHDL 4 07/08/2022   Lab Results  Component Value Date   ALT 9 07/08/2022   AST 16 07/08/2022   ALKPHOS 80 07/08/2022    BILITOT 0.6 07/08/2022    Vitamin D deficiency Low at 13.9 in January 2021, then improved to 32.7 few months later.  Off supplements at his last visit in September 2023.  Stable at that time. Last vitamin D Lab Results  Component Value Date   VD25OH 41.79 07/08/2022  No supplements.       07/11/2023    9:41 AM 07/08/2022    8:32 AM 06/30/2021   10:25 AM 12/23/2020   11:21 AM 10/27/2020   11:03 AM  Depression screen PHQ 2/9  Decreased Interest 0 0 0 0 0  Down, Depressed, Hopeless 0 0 0 0 0  PHQ - 2 Score 0 0 0 0 0  Altered sleeping 0 0 0    Tired, decreased energy 0 0 0    Change in appetite 0 0 0    Feeling bad or failure about yourself  0 0 0    Trouble concentrating 0 0 0    Moving slowly or fidgety/restless 0 0 0    Suicidal thoughts 0 0 0    PHQ-9 Score 0 0 0    Difficult doing work/chores Not difficult at all  Not difficult at all      Health Maintenance  Topic Date Due   DTaP/Tdap/Td (1 - Tdap) Never done   Colonoscopy  Never done   COVID-19 Vaccine (1 - 2023-24 season) Never done   Zoster Vaccines- Shingrix (1 of 2) 10/10/2023 (Originally 03/02/2022)   INFLUENZA VACCINE  01/16/2024 (Originally 05/19/2023)   Hepatitis C Screening  Completed   HIV Screening  Completed   HPV VACCINES  Aged Out  Cologuard ordered last year. Has not done.  Prostate: does noi have family history of prostate cancer The natural history of prostate cancer and ongoing controversy regarding screening and potential treatment outcomes of prostate cancer has been discussed with the patient. The meaning of a false positive PSA and a false negative PSA has been discussed. He indicates understanding of the limitations of this screening test and wishes NOT to proceed with screening PSA testing. No results found for: "PSA1", "PSA" Declines lung cancer screening.  Hx of testicular CA.    There is no immunization history on file for this patient. Flu vaccine, COVID-vaccine declined.  Shingles vaccine  declined.  Tdap declined.  No results found. Optho once a year  Dental:every 6 months.   Alcohol:as above.   Tobacco: 1ppd as above.   Exercise: active with work, Naval architect.    History Patient Active Problem List   Diagnosis Date Noted   Palpitations 11/12/2019   Essential hypertension 11/12/2019   Paresthesia and pain of both upper extremities 11/12/2019   No past medical history on file. No past surgical history on file. No Known Allergies Prior to Admission medications   Medication Sig Start Date End Date Taking? Authorizing Provider  amLODipine-benazepril (LOTREL) 10-20 MG capsule Take 1 capsule by mouth once daily 06/23/23   Shade Flood, MD   Social History   Socioeconomic History   Marital status: Single    Spouse name: Not on file   Number of children: Not on file   Years of education: Not on file   Highest education level: Not on file  Occupational History   Not on file  Tobacco Use   Smoking status: Every Day   Smokeless tobacco: Never  Substance and Sexual Activity   Alcohol use: Yes    Alcohol/week: 0.0 standard drinks of alcohol   Drug use: Not Currently   Sexual activity: Not on file  Other Topics Concern   Not on file  Social History Narrative   Not on file   Social Determinants of Health   Financial Resource Strain: Not on file  Food Insecurity: Not on file  Transportation Needs: Not on file  Physical Activity: Not on file  Stress: Not on file  Social Connections: Not on file  Intimate Partner Violence: Not on file    Review of Systems  Constitutional:  Negative for fatigue and unexpected weight change.  Eyes:  Negative for visual disturbance.  Respiratory:  Negative for cough, chest tightness and shortness of breath.   Cardiovascular:  Negative for chest pain, palpitations and leg swelling.  Gastrointestinal:  Negative for abdominal pain and blood in stool.  Neurological:  Negative for dizziness, light-headedness and  headaches.   13 point review of systems per patient health survey noted.  Negative other than as indicated above or in HPI.    Objective:   Vitals:   07/11/23 0928  BP: 130/84  Pulse: 76  Temp: 97.9 F (36.6 C)  TempSrc: Oral  SpO2: 98%  Weight: 163 lb 6.4 oz (74.1 kg)  Height: 5' 11.5" (1.816 m)   {Vitals History (  Optional):23777}  Physical Exam Vitals reviewed.  Constitutional:      Appearance: He is well-developed.  HENT:     Head: Normocephalic and atraumatic.     Right Ear: External ear normal.     Left Ear: External ear normal.  Eyes:     Conjunctiva/sclera: Conjunctivae normal.     Pupils: Pupils are equal, round, and reactive to light.  Neck:     Thyroid: No thyromegaly.  Cardiovascular:     Rate and Rhythm: Normal rate and regular rhythm.     Heart sounds: Normal heart sounds.  Pulmonary:     Effort: Pulmonary effort is normal. No respiratory distress.     Breath sounds: Normal breath sounds. No wheezing.  Abdominal:     General: There is no distension.     Palpations: Abdomen is soft.     Tenderness: There is no abdominal tenderness.  Musculoskeletal:        General: No tenderness. Normal range of motion.     Cervical back: Normal range of motion and neck supple.  Lymphadenopathy:     Cervical: No cervical adenopathy.  Skin:    General: Skin is warm and dry.  Neurological:     Mental Status: He is alert and oriented to person, place, and time.     Deep Tendon Reflexes: Reflexes are normal and symmetric.  Psychiatric:        Behavior: Behavior normal.        Assessment & Plan:  Jamesdavid Stifel is a 52 y.o. male . Annual physical exam  Essential hypertension, benign - Plan: Comprehensive metabolic panel, TSH  Vitamin D deficiency  Screen for colon cancer - Plan: Cologuard  Cigarette nicotine dependence without complication  Screening for diabetes mellitus - Plan: HgB A1c  Hyperlipidemia, unspecified hyperlipidemia type - Plan: Lipid  panel, Comprehensive metabolic panel  Screening, anemia, deficiency, iron - Plan: CBC with Differential/Platelet  History of vitamin D deficiency - Plan: Vitamin D, 25-hydroxy   No orders of the defined types were placed in this encounter.  Patient Instructions  Try cutting back on beer - goal of no more than 1-2 per day. If blood pressure remains over 130/80 with this change let me know.   See info below on quitting smoking. Let me know if I can help.   I will order the Cologuard colon cancer screening test again.  Please the box in the bathroom to help remember to collect specimen, and try to have that completed in the next 2 weeks.  I would recommend lung cancer screening with history of smoking, let me know if I can order that test.  Please let me know if there are any questions from today's visit and take care.  Managing the Challenge of Quitting Smoking Quitting smoking is a physical and mental challenge. You may have cravings, withdrawal symptoms, and temptation to smoke. Before quitting, work with your health care provider to make a plan that can help you manage quitting. Making a plan before you quit may keep you from smoking when you have the urge to smoke while trying to quit. How to manage lifestyle changes Managing stress Stress can make you want to smoke, and wanting to smoke may cause stress. It is important to find ways to manage your stress. You could try some of the following: Practice relaxation techniques. Breathe slowly and deeply, in through your nose and out through your mouth. Listen to music. Soak in a bath or take a shower. Imagine a  peaceful place or vacation. Get some support. Talk with family or friends about your stress. Join a support group. Talk with a counselor or therapist. Get some physical activity. Go for a walk, run, or bike ride. Play a favorite sport. Practice yoga.  Medicines Talk with your health care provider about medicines that might  help you deal with cravings and make quitting easier for you. Relationships Social situations can be difficult when you are quitting smoking. To manage this, you can: Avoid parties and other social situations where people might be smoking. Avoid alcohol. Leave right away if you have the urge to smoke. Explain to your family and friends that you are quitting smoking. Ask for support and let them know you might be a bit grumpy. Plan activities where smoking is not an option. General instructions Be aware that many people gain weight after they quit smoking. However, not everyone does. To keep from gaining weight, have a plan in place before you quit, and stick to the plan after you quit. Your plan should include: Eating healthy snacks. When you have a craving, it may help to: Eat popcorn, or try carrots, celery, or other cut vegetables. Chew sugar-free gum. Changing how you eat. Eat small portion sizes at meals. Eat 4-6 small meals throughout the day instead of 1-2 large meals a day. Be mindful when you eat. You should avoid watching television or doing other things that might distract you as you eat. Exercising regularly. Make time to exercise each day. If you do not have time for a long workout, do short bouts of exercise for 5-10 minutes several times a day. Do some form of strengthening exercise, such as weight lifting. Do some exercise that gets your heart beating and causes you to breathe deeply, such as walking fast, running, swimming, or biking. This is very important. Drinking plenty of water or other low-calorie or no-calorie drinks. Drink enough fluid to keep your urine pale yellow.  How to recognize withdrawal symptoms Your body and mind may experience discomfort as you try to get used to not having nicotine in your system. These effects are called withdrawal symptoms. They may include: Feeling hungrier than normal. Having trouble concentrating. Feeling irritable or  restless. Having trouble sleeping. Feeling depressed. Craving a cigarette. These symptoms may surprise you, but they are normal to have when quitting smoking. To manage withdrawal symptoms: Avoid places, people, and activities that trigger your cravings. Remember why you want to quit. Get plenty of sleep. Avoid coffee and other drinks that contain caffeine. These may worsen some of your symptoms. How to manage cravings Come up with a plan for how to deal with your cravings. The plan should include the following: A definition of the specific situation you want to deal with. An activity or action you will take to replace smoking. A clear idea for how this action will help. The name of someone who could help you with this. Cravings usually last for 5-10 minutes. Consider taking the following actions to help you with your plan to deal with cravings: Keep your mouth busy. Chew sugar-free gum. Suck on hard candies or a straw. Brush your teeth. Keep your hands and body busy. Change to a different activity right away. Squeeze or play with a ball. Do an activity or a hobby, such as making bead jewelry, practicing needlepoint, or working with wood. Mix up your normal routine. Take a short exercise break. Go for a quick walk, or run up and down stairs. Focus  on doing something kind or helpful for someone else. Call a friend or family member to talk during a craving. Join a support group. Contact a quitline. Where to find support To get help or find a support group: Call the National Cancer Institute's Smoking Quitline: 1-800-QUIT-NOW 603-009-1905) Text QUIT to SmokefreeTXT: 469629 Where to find more information Visit these websites to find more information on quitting smoking: [place]. Department of Health and Human Services: www.smokefree.gov American Lung Association: www.freedomfromsmoking.org Centers for Disease Control and Prevention (CDC): FootballExhibition.com.br American Heart Association:  www.heart.org Contact a health care provider if: You want to change your plan for quitting. The medicines you are taking are not helping. Your eating feels out of control or you cannot sleep. You feel depressed or become very anxious. Summary Quitting smoking is a physical and mental challenge. You will face cravings, withdrawal symptoms, and temptation to smoke again. Preparation can help you as you go through these challenges. Try different techniques to manage stress, handle social situations, and prevent weight gain. You can deal with cravings by keeping your mouth busy (such as by chewing gum), keeping your hands and body busy, calling family or friends, or contacting a quitline for people who want to quit smoking. You can deal with withdrawal symptoms by avoiding places where people smoke, getting plenty of rest, and avoiding drinks that contain caffeine. This information is not intended to replace advice given to you by your health care provider. Make sure you discuss any questions you have with your health care provider. Document Revised: 09/25/2021 Document Reviewed: 09/25/2021 Elsevier Patient Education  2024 Elsevier Inc.   Steps to Quit Smoking Smoking tobacco is the leading cause of preventable death. It can affect almost every organ in the body. Smoking puts you and those around you at risk for developing many serious chronic diseases. Quitting smoking can be very challenging. Do not get discouraged if you are not successful the first time. Some people need to make many attempts to quit before they achieve long-term success. Do your best to stick to your quit plan, and talk with your health care provider if you have any questions or concerns. How do I get ready to quit? When you decide to quit smoking, create a plan to help you succeed. Before you quit: Pick a date to quit. Set a date within the next 2 weeks to give you time to prepare. Write down the reasons why you are  quitting. Keep this list in places where you will see it often. Tell your family, friends, and co-workers that you are quitting. Support from people you are close to can make quitting easier. Talk with your health care provider about your options for quitting smoking. Find out what treatment options are covered by your health insurance. Identify people, places, things, and activities that make you want to smoke (triggers). Avoid them. What first steps can I take to quit smoking? Throw away all cigarettes at home, at work, and in your car. Throw away smoking accessories, such as Set designer. Clean your car. Make sure to empty the ashtray. Clean your home, including curtains and carpets. What strategies can I use to quit smoking? Talk with your health care provider about combining strategies, such as taking medicines while you are also receiving in-person counseling. Using these two strategies together makes you more likely to succeed in quitting than if you used either strategy on its own. If you are pregnant or breastfeeding, talk with your health care provider about finding  counseling or other support strategies to quit smoking. Do not take medicine to help you quit smoking unless your health care provider tells you to. Quit right away Quit smoking completely, instead of gradually reducing how much you smoke over a period of time. Stopping smoking right away may be more successful than gradually quitting. Attend in-person counseling to help you build problem-solving skills. You are more likely to succeed in quitting if you attend counseling sessions regularly. Even short sessions of 10 minutes can be effective. Take medicine You may take medicines to help you quit smoking. Some medicines require a prescription. You can also purchase over-the-counter medicines. Medicines may have nicotine in them to replace the nicotine in cigarettes. Medicines may: Help to stop cravings. Help to relieve  withdrawal symptoms. Your health care provider may recommend: Nicotine patches, gum, or lozenges. Nicotine inhalers or sprays. Non-nicotine medicine that you take by mouth. Find resources Find resources and support systems that can help you quit smoking and remain smoke-free after you quit. These resources are most helpful when you use them often. They include: Online chats with a Veterinary surgeon. Telephone quitlines. Printed Materials engineer. Support groups or group counseling. Text messaging programs. Mobile phone apps or applications. Use apps that can help you stick to your quit plan by providing reminders, tips, and encouragement. Examples of free services include Quit Guide from the CDC and smokefree.gov  What can I do to make it easier to quit?  Reach out to your family and friends for support and encouragement. Call telephone quitlines, such as 1-800-QUIT-NOW, reach out to support groups, or work with a counselor for support. Ask people who smoke to avoid smoking around you. Avoid places that trigger you to smoke, such as bars, parties, or smoke-break areas at work. Spend time with people who do not smoke. Lessen the stress in your life. Stress can be a smoking trigger for some people. To lessen stress, try: Exercising regularly. Doing deep-breathing exercises. Doing yoga. Meditating. What benefits will I see if I quit smoking? Over time, you should start to see positive results, such as: Improved sense of smell and taste. Decreased coughing and sore throat. Slower heart rate. Lower blood pressure. Clearer and healthier skin. The ability to breathe more easily. Fewer sick days. Summary Quitting smoking can be very challenging. Do not get discouraged if you are not successful the first time. Some people need to make many attempts to quit before they achieve long-term success. When you decide to quit smoking, create a plan to help you succeed. Quit smoking right away, not  slowly over a period of time. Find resources and support systems that can help you quit smoking and remain smoke-free after you quit. This information is not intended to replace advice given to you by your health care provider. Make sure you discuss any questions you have with your health care provider. Document Revised: 09/25/2021 Document Reviewed: 09/25/2021 Elsevier Patient Education  2024 [employer].   Preventive Care 94-21 Years Old, Male Preventive care refers to lifestyle choices and visits with your health care provider that can promote health and wellness. Preventive care visits are also called wellness exams. What can I expect for my preventive care visit? Counseling During your preventive care visit, your health care provider may ask about your: Medical history, including: Past medical problems. Family medical history. Current health, including: Emotional well-being. Home life and relationship well-being. Sexual activity. Lifestyle, including: Alcohol, nicotine or tobacco, and drug use. Access to firearms. Diet, exercise, and  sleep habits. Safety issues such as seatbelt and bike helmet use. Sunscreen use. Work and work Astronomer. Physical exam Your health care provider will check your: Height and weight. These may be used to calculate your BMI (body mass index). BMI is a measurement that tells if you are at a healthy weight. Waist circumference. This measures the distance around your waistline. This measurement also tells if you are at a healthy weight and may help predict your risk of certain diseases, such as type 2 diabetes and high blood pressure. Heart rate and blood pressure. Body temperature. Skin for abnormal spots. What immunizations do I need?  Vaccines are usually given at various ages, according to a schedule. Your health care provider will recommend vaccines for you based on your age, medical history, and lifestyle or other factors, such as travel or  where you work. What tests do I need? Screening Your health care provider may recommend screening tests for certain conditions. This may include: Lipid and cholesterol levels. Diabetes screening. This is done by checking your blood sugar (glucose) after you have not eaten for a while (fasting). Hepatitis B test. Hepatitis C test. HIV (human immunodeficiency virus) test. STI (sexually transmitted infection) testing, if you are at risk. Lung cancer screening. Prostate cancer screening. Colorectal cancer screening. Talk with your health care provider about your test results, treatment options, and if necessary, the need for more tests. Follow these instructions at home: Eating and drinking  Eat a diet that includes fresh fruits and vegetables, whole grains, lean protein, and low-fat dairy products. Take vitamin and mineral supplements as recommended by your health care provider. Do not drink alcohol if your health care provider tells you not to drink. If you drink alcohol: Limit how much you have to 0-2 drinks a day. Know how much alcohol is in your drink. In the [place] one drink equals one 12 oz bottle of beer (355 mL), one 5 oz glass of wine (148 mL), or one 1 oz glass of hard liquor (44 mL). Lifestyle Brush your teeth every morning and night with fluoride toothpaste. Floss one time each day. Exercise for at least 30 minutes 5 or more days each week. Do not use any products that contain nicotine or tobacco. These products include cigarettes, chewing tobacco, and vaping devices, such as e-cigarettes. If you need help quitting, ask your health care provider. Do not use drugs. If you are sexually active, practice safe sex. Use a condom or other form of protection to prevent STIs. Take aspirin only as told by your health care provider. Make sure that you understand how much to take and what form to take. Work with your health care provider to find out whether it is safe and beneficial for you  to take aspirin daily. Find healthy ways to manage stress, such as: Meditation, yoga, or listening to music. Journaling. Talking to a trusted person. Spending time with friends and family. Minimize exposure to UV radiation to reduce your risk of skin cancer. Safety Always wear your seat belt while driving or riding in a vehicle. Do not drive: If you have been drinking alcohol. Do not ride with someone who has been drinking. When you are tired or distracted. While texting. If you have been using any mind-altering substances or drugs. Wear a helmet and other protective equipment during sports activities. If you have firearms in your house, make sure you follow all gun safety procedures. What's next? Go to your health care provider once a year for  an annual wellness visit. Ask your health care provider how often you should have your eyes and teeth checked. Stay up to date on all vaccines. This information is not intended to replace advice given to you by your health care provider. Make sure you discuss any questions you have with your health care provider. Document Revised: 04/01/2021 Document Reviewed: 04/01/2021 Elsevier Patient Education  2024 Elsevier Inc.     Signed,   Meredith Staggers, MD [facility], [facility] 07/11/23 9:59 AM

## 2023-07-21 ENCOUNTER — Other Ambulatory Visit: Payer: Self-pay | Admitting: Family Medicine

## 2023-07-21 DIAGNOSIS — I10 Essential (primary) hypertension: Secondary | ICD-10-CM

## 2023-08-16 ENCOUNTER — Other Ambulatory Visit: Payer: Self-pay | Admitting: Family Medicine

## 2023-08-16 DIAGNOSIS — I10 Essential (primary) hypertension: Secondary | ICD-10-CM

## 2023-09-15 ENCOUNTER — Other Ambulatory Visit: Payer: Self-pay | Admitting: Family Medicine

## 2023-09-15 DIAGNOSIS — I10 Essential (primary) hypertension: Secondary | ICD-10-CM

## 2023-09-20 ENCOUNTER — Other Ambulatory Visit: Payer: Self-pay | Admitting: Family Medicine

## 2023-09-20 DIAGNOSIS — I10 Essential (primary) hypertension: Secondary | ICD-10-CM

## 2023-10-17 ENCOUNTER — Other Ambulatory Visit: Payer: Self-pay | Admitting: Family Medicine

## 2023-10-17 DIAGNOSIS — I10 Essential (primary) hypertension: Secondary | ICD-10-CM

## 2023-10-20 ENCOUNTER — Other Ambulatory Visit: Payer: Self-pay | Admitting: Family Medicine

## 2023-10-20 DIAGNOSIS — I10 Essential (primary) hypertension: Secondary | ICD-10-CM

## 2023-11-15 ENCOUNTER — Other Ambulatory Visit: Payer: Self-pay | Admitting: Family Medicine

## 2023-11-15 ENCOUNTER — Other Ambulatory Visit: Payer: Self-pay

## 2023-11-15 DIAGNOSIS — I10 Essential (primary) hypertension: Secondary | ICD-10-CM

## 2024-01-09 ENCOUNTER — Ambulatory Visit: Payer: BC Managed Care – PPO | Admitting: Family Medicine

## 2024-01-09 ENCOUNTER — Encounter: Payer: Self-pay | Admitting: Family Medicine

## 2024-01-09 VITALS — BP 128/68 | HR 65 | Temp 98.3°F | Ht 71.5 in | Wt 165.2 lb

## 2024-01-09 DIAGNOSIS — R7309 Other abnormal glucose: Secondary | ICD-10-CM

## 2024-01-09 DIAGNOSIS — E7800 Pure hypercholesterolemia, unspecified: Secondary | ICD-10-CM | POA: Diagnosis not present

## 2024-01-09 DIAGNOSIS — D582 Other hemoglobinopathies: Secondary | ICD-10-CM | POA: Diagnosis not present

## 2024-01-09 DIAGNOSIS — I10 Essential (primary) hypertension: Secondary | ICD-10-CM | POA: Diagnosis not present

## 2024-01-09 DIAGNOSIS — F17210 Nicotine dependence, cigarettes, uncomplicated: Secondary | ICD-10-CM

## 2024-01-09 DIAGNOSIS — J301 Allergic rhinitis due to pollen: Secondary | ICD-10-CM

## 2024-01-09 LAB — COMPREHENSIVE METABOLIC PANEL
ALT: 9 U/L (ref 0–53)
AST: 19 U/L (ref 0–37)
Albumin: 4.7 g/dL (ref 3.5–5.2)
Alkaline Phosphatase: 68 U/L (ref 39–117)
BUN: 8 mg/dL (ref 6–23)
CO2: 31 meq/L (ref 19–32)
Calcium: 9.7 mg/dL (ref 8.4–10.5)
Chloride: 100 meq/L (ref 96–112)
Creatinine, Ser: 0.89 mg/dL (ref 0.40–1.50)
GFR: 98.98 mL/min (ref >60.00)
Glucose, Bld: 103 mg/dL — ABNORMAL HIGH (ref 70–99)
Potassium: 4.2 meq/L (ref 3.5–5.1)
Sodium: 138 meq/L (ref 135–145)
Total Bilirubin: 0.5 mg/dL (ref 0.2–1.2)
Total Protein: 7.6 g/dL (ref 6.0–8.3)

## 2024-01-09 LAB — CBC
HCT: 50.7 % (ref 39.0–52.0)
Hemoglobin: 17.3 g/dL — ABNORMAL HIGH (ref 13.0–17.0)
MCHC: 34.2 g/dL (ref 30.0–36.0)
MCV: 101.9 fl — ABNORMAL HIGH (ref 78.0–100.0)
Platelets: 148 10*3/uL — ABNORMAL LOW (ref 150.0–400.0)
RBC: 4.98 Mil/uL (ref 4.22–5.81)
RDW: 12.8 % (ref 11.5–15.5)
WBC: 5.9 10*3/uL (ref 4.0–10.5)

## 2024-01-09 LAB — LIPID PANEL
Cholesterol: 170 mg/dL (ref 0–200)
HDL: 50.5 mg/dL (ref >39.00)
LDL Cholesterol: 100 mg/dL — ABNORMAL HIGH (ref 0–99)
NonHDL: 119.03
Total CHOL/HDL Ratio: 3
Triglycerides: 95 mg/dL (ref 0.0–149.0)
VLDL: 19 mg/dL (ref 0.0–40.0)

## 2024-01-09 LAB — HEMOGLOBIN A1C: Hgb A1c MFr Bld: 5.5 % (ref 4.6–6.5)

## 2024-01-09 MED ORDER — AMLODIPINE BESY-BENAZEPRIL HCL 10-20 MG PO CAPS: 1.0000 | ORAL_CAPSULE | Freq: Every day | ORAL | 1 refills | Status: DC
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
## 2024-01-09 NOTE — Patient Instructions (Addendum)
 Claritin is fine for allergies. Try flonase if needed - that may be more effective for allergies - use as needed.   Keep up the good work with cutting back on alcohol and cigarettes with goal of complete cessation of cigarettes. Let me know if we can help and see info below.   Managing the Challenge of Quitting Smoking Quitting smoking is a physical and mental challenge. You may have cravings, withdrawal symptoms, and temptation to smoke. Before quitting, work with your health care provider to make a plan that can help you manage quitting. Making a plan before you quit may keep you from smoking when you have the urge to smoke while trying to quit. How to manage lifestyle changes Managing stress Stress can make you want to smoke, and wanting to smoke may cause stress. It is important to find ways to manage your stress. You could try some of the following: Practice relaxation techniques. Breathe slowly and deeply, in through your nose and out through your mouth. Listen to music. Soak in a bath or take a shower. Imagine a peaceful place or vacation. Get some support. Talk with family or friends about your stress. Join a support group. Talk with a counselor or therapist. Get some physical activity. Go for a walk, run, or bike ride. Play a favorite sport. Practice yoga.  Medicines Talk with your health care provider about medicines that might help you deal with cravings and make quitting easier for you. Relationships Social situations can be difficult when you are quitting smoking. To manage this, you can: Avoid parties and other social situations where people might be smoking. Avoid alcohol. Leave right away if you have the urge to smoke. Explain to your family and friends that you are quitting smoking. Ask for support and let them know you might be a bit grumpy. Plan activities where smoking is not an option. General instructions Be aware that many people gain weight after they quit  smoking. However, not everyone does. To keep from gaining weight, have a plan in place before you quit, and stick to the plan after you quit. Your plan should include: Eating healthy snacks. When you have a craving, it may help to: Eat popcorn, or try carrots, celery, or other cut vegetables. Chew sugar-free gum. Changing how you eat. Eat small portion sizes at meals. Eat 4-6 small meals throughout the day instead of 1-2 large meals a day. Be mindful when you eat. You should avoid watching television or doing other things that might distract you as you eat. Exercising regularly. Make time to exercise each day. If you do not have time for a long workout, do short bouts of exercise for 5-10 minutes several times a day. Do some form of strengthening exercise, such as weight lifting. Do some exercise that gets your heart beating and causes you to breathe deeply, such as walking fast, running, swimming, or biking. This is very important. Drinking plenty of water or other low-calorie or no-calorie drinks. Drink enough fluid to keep your urine pale yellow.  How to recognize withdrawal symptoms Your body and mind may experience discomfort as you try to get used to not having nicotine in your system. These effects are called withdrawal symptoms. They may include: Feeling hungrier than normal. Having trouble concentrating. Feeling irritable or restless. Having trouble sleeping. Feeling depressed. Craving a cigarette. These symptoms may surprise you, but they are normal to have when quitting smoking. To manage withdrawal symptoms: Avoid places, people, and activities that trigger  your cravings. Remember why you want to quit. Get plenty of sleep. Avoid coffee and other drinks that contain caffeine. These may worsen some of your symptoms. How to manage cravings Come up with a plan for how to deal with your cravings. The plan should include the following: A definition of the specific situation you  want to deal with. An activity or action you will take to replace smoking. A clear idea for how this action will help. The name of someone who could help you with this. Cravings usually last for 5-10 minutes. Consider taking the following actions to help you with your plan to deal with cravings: Keep your mouth busy. Chew sugar-free gum. Suck on hard candies or a straw. Brush your teeth. Keep your hands and body busy. Change to a different activity right away. Squeeze or play with a ball. Do an activity or a hobby, such as making bead jewelry, practicing needlepoint, or working with wood. Mix up your normal routine. Take a short exercise break. Go for a quick walk, or run up and down stairs. Focus on doing something kind or helpful for someone else. Call a friend or family member to talk during a craving. Join a support group. Contact a quitline. Where to find support To get help or find a support group: Call the National Cancer Institute's Smoking Quitline: 1-800-QUIT-NOW (630) 664-1188) Text QUIT to SmokefreeTXT: 478295 Where to find more information Visit these websites to find more information on quitting smoking: U.S. Department of Health and Human Services: www.smokefree.gov American Lung Association: www.freedomfromsmoking.org Centers for Disease Control and Prevention (CDC): FootballExhibition.com.br American Heart Association: www.heart.org Contact a health care provider if: You want to change your plan for quitting. The medicines you are taking are not helping. Your eating feels out of control or you cannot sleep. You feel depressed or become very anxious. Summary Quitting smoking is a physical and mental challenge. You will face cravings, withdrawal symptoms, and temptation to smoke again. Preparation can help you as you go through these challenges. Try different techniques to manage stress, handle social situations, and prevent weight gain. You can deal with cravings by keeping your  mouth busy (such as by chewing gum), keeping your hands and body busy, calling family or friends, or contacting a quitline for people who want to quit smoking. You can deal with withdrawal symptoms by avoiding places where people smoke, getting plenty of rest, and avoiding drinks that contain caffeine. This information is not intended to replace advice given to you by your health care provider. Make sure you discuss any questions you have with your health care provider. Document Revised: 09/25/2021 Document Reviewed: 09/25/2021 Elsevier Patient Education  2024 [employer].

## 2024-01-10 ENCOUNTER — Encounter: Payer: Self-pay | Admitting: Family Medicine

## 2024-02-13 ENCOUNTER — Other Ambulatory Visit: Payer: Self-pay | Admitting: Family Medicine

## 2024-02-13 DIAGNOSIS — I10 Essential (primary) hypertension: Secondary | ICD-10-CM

## 2024-02-13 NOTE — Telephone Encounter (Signed)
 Copied from CRM [id number]. Topic: Clinical - Medication Refill >> Feb 13, 2024  1:06 PM Earnestine Goes B wrote: Most Recent Primary Care Visit:  Provider: Caro Christmas R  Department: [facility]  Visit Type: OFFICE VISIT  Date: 01/09/2024  Medication: amLODipine -benazepril  (LOTREL) 10-20 MG capsule  Has the patient contacted their pharmacy? Yes (Agent: If no, request that the patient contact the pharmacy for the refill. If patient does not wish to contact the pharmacy document the reason why and proceed with request.) (Agent: If yes, when and what did the pharmacy advise?)  Is this the correct pharmacy for this prescription? Yes If no, delete pharmacy and type the correct one.  This is the patient's preferred pharmacy:  [id number] - [place], [place] - [address] [place] [address] Phone: [phone] Fax: [phone]   Has the prescription been filled recently? Yes  Is the patient out of the medication? Yes  Has the patient been seen for an appointment in the last year OR does the patient have an upcoming appointment? Yes  Can we respond through MyChart? Yes  Agent: Please be advised that Rx refills may take up to 3 business days. We ask that you follow-up with your pharmacy.

## 2024-02-14 NOTE — Telephone Encounter (Signed)
 New script sent in and confirmed by [facility] on 01/09/24

## 2024-07-11 ENCOUNTER — Ambulatory Visit (INDEPENDENT_AMBULATORY_CARE_PROVIDER_SITE_OTHER): Admitting: Family Medicine

## 2024-07-11 ENCOUNTER — Encounter: Payer: Self-pay | Admitting: Family Medicine

## 2024-07-11 VITALS — BP 130/84 | HR 65 | Resp 16 | Ht 71.0 in | Wt 167.0 lb

## 2024-07-11 DIAGNOSIS — F17210 Nicotine dependence, cigarettes, uncomplicated: Secondary | ICD-10-CM | POA: Diagnosis not present

## 2024-07-11 DIAGNOSIS — Z0000 Encounter for general adult medical examination without abnormal findings: Secondary | ICD-10-CM | POA: Diagnosis not present

## 2024-07-11 DIAGNOSIS — D582 Other hemoglobinopathies: Secondary | ICD-10-CM

## 2024-07-11 DIAGNOSIS — E7800 Pure hypercholesterolemia, unspecified: Secondary | ICD-10-CM

## 2024-07-11 DIAGNOSIS — D696 Thrombocytopenia, unspecified: Secondary | ICD-10-CM | POA: Diagnosis not present

## 2024-07-11 DIAGNOSIS — Z1211 Encounter for screening for malignant neoplasm of colon: Secondary | ICD-10-CM

## 2024-07-11 DIAGNOSIS — Z122 Encounter for screening for malignant neoplasm of respiratory organs: Secondary | ICD-10-CM

## 2024-07-11 DIAGNOSIS — Z125 Encounter for screening for malignant neoplasm of prostate: Secondary | ICD-10-CM

## 2024-07-11 DIAGNOSIS — I10 Essential (primary) hypertension: Secondary | ICD-10-CM

## 2024-07-11 LAB — COMPREHENSIVE METABOLIC PANEL WITH GFR
ALT: 9 U/L (ref 0–53)
AST: 16 U/L (ref 0–37)
Albumin: 4.7 g/dL (ref 3.5–5.2)
Alkaline Phosphatase: 67 U/L (ref 39–117)
BUN: 9 mg/dL (ref 6–23)
CO2: 29 meq/L (ref 19–32)
Calcium: 9.4 mg/dL (ref 8.4–10.5)
Chloride: 100 meq/L (ref 96–112)
Creatinine, Ser: 0.84 mg/dL (ref 0.40–1.50)
GFR: 100.37 mL/min (ref >60.00)
Glucose, Bld: 97 mg/dL (ref 70–99)
Potassium: 4.1 meq/L (ref 3.5–5.1)
Sodium: 137 meq/L (ref 135–145)
Total Bilirubin: 0.7 mg/dL (ref 0.2–1.2)
Total Protein: 7.3 g/dL (ref 6.0–8.3)

## 2024-07-11 LAB — CBC
HCT: 50.4 % (ref 39.0–52.0)
Hemoglobin: 16.9 g/dL (ref 13.0–17.0)
MCHC: 33.5 g/dL (ref 30.0–36.0)
MCV: 102 fl — ABNORMAL HIGH (ref 78.0–100.0)
Platelets: 131 K/uL — ABNORMAL LOW (ref 150.0–400.0)
RBC: 4.94 Mil/uL (ref 4.22–5.81)
RDW: 13.2 % (ref 11.5–15.5)
WBC: 6 K/uL (ref 4.0–10.5)

## 2024-07-11 LAB — LIPID PANEL
Cholesterol: 190 mg/dL (ref 0–200)
HDL: 56.5 mg/dL (ref >39.00)
LDL Cholesterol: 116 mg/dL — ABNORMAL HIGH (ref 0–99)
NonHDL: 133.61
Total CHOL/HDL Ratio: 3
Triglycerides: 89 mg/dL (ref 0.0–149.0)
VLDL: 17.8 mg/dL (ref 0.0–40.0)

## 2024-07-11 LAB — PSA: PSA: 0.56 ng/mL (ref 0.10–4.00)

## 2024-07-11 LAB — TSH: TSH: 0.44 u[IU]/mL (ref 0.35–5.50)

## 2024-07-11 MED ORDER — AMLODIPINE BESY-BENAZEPRIL HCL 10-20 MG PO CAPS: 1.0000 | ORAL_CAPSULE | Freq: Every day | ORAL | 1 refills | Status: AC

## 2024-07-11 NOTE — Patient Instructions (Signed)
 Thank you for coming in today. No change in medications at this time, but see information below on managing blood pressure.  I would like to see that below 130/80.  I will refer you for lung cancer screening.  Continue to try to cut back on cigarettes and let me know if I can help.  Continue to try to decrease alcohol as well.I ordered the Cologuard colon cancer screening test which should be shipped to your house soon.  Try to place that in the bathroom as a reminder to have that testing done.  Let me know if there are questions.  If there are any concerns on your bloodwork, I will let you know. Take care!  Preventive Care 75-67 Years Old, Male Preventive care refers to lifestyle choices and visits with your health care provider that can promote health and wellness. Preventive care visits are also called wellness exams. What can I expect for my preventive care visit? Counseling During your preventive care visit, your health care provider may ask about your: Medical history, including: Past medical problems. Family medical history. Current health, including: Emotional well-being. Home life and relationship well-being. Sexual activity. Lifestyle, including: Alcohol, nicotine or tobacco, and drug use. Access to firearms. Diet, exercise, and sleep habits. Safety issues such as seatbelt and bike helmet use. Sunscreen use. Work and work Astronomer. Physical exam Your health care provider will check your: Height and weight. These may be used to calculate your BMI (body mass index). BMI is a measurement that tells if you are at a healthy weight. Waist circumference. This measures the distance around your waistline. This measurement also tells if you are at a healthy weight and may help predict your risk of certain diseases, such as type 2 diabetes and high blood pressure. Heart rate and blood pressure. Body temperature. Skin for abnormal spots. What immunizations do I need?  Vaccines are  usually given at various ages, according to a schedule. Your health care provider will recommend vaccines for you based on your age, medical history, and lifestyle or other factors, such as travel or where you work. What tests do I need? Screening Your health care provider may recommend screening tests for certain conditions. This may include: Lipid and cholesterol levels. Diabetes screening. This is done by checking your blood sugar (glucose) after you have not eaten for a while (fasting). Hepatitis B test. Hepatitis C test. HIV (human immunodeficiency virus) test. STI (sexually transmitted infection) testing, if you are at risk. Lung cancer screening. Prostate cancer screening. Colorectal cancer screening. Talk with your health care provider about your test results, treatment options, and if necessary, the need for more tests. Follow these instructions at home: Eating and drinking  Eat a diet that includes fresh fruits and vegetables, whole grains, lean protein, and low-fat dairy products. Take vitamin and mineral supplements as recommended by your health care provider. Do not drink alcohol if your health care provider tells you not to drink. If you drink alcohol: Limit how much you have to 0-2 drinks a day. Know how much alcohol is in your drink. In the [place] one drink equals one 12 oz bottle of beer (355 mL), one 5 oz glass of wine (148 mL), or one 1 oz glass of hard liquor (44 mL). Lifestyle Brush your teeth every morning and night with fluoride toothpaste. Floss one time each day. Exercise for at least 30 minutes 5 or more days each week. Do not use any products that contain nicotine or tobacco. These products  include cigarettes, chewing tobacco, and vaping devices, such as e-cigarettes. If you need help quitting, ask your health care provider. Do not use drugs. If you are sexually active, practice safe sex. Use a condom or other form of protection to prevent STIs. Take aspirin  only as told by your health care provider. Make sure that you understand how much to take and what form to take. Work with your health care provider to find out whether it is safe and beneficial for you to take aspirin daily. Find healthy ways to manage stress, such as: Meditation, yoga, or listening to music. Journaling. Talking to a trusted person. Spending time with friends and family. Minimize exposure to UV radiation to reduce your risk of skin cancer. Safety Always wear your seat belt while driving or riding in a vehicle. Do not drive: If you have been drinking alcohol. Do not ride with someone who has been drinking. When you are tired or distracted. While texting. If you have been using any mind-altering substances or drugs. Wear a helmet and other protective equipment during sports activities. If you have firearms in your house, make sure you follow all gun safety procedures. What's next? Go to your health care provider once a year for an annual wellness visit. Ask your health care provider how often you should have your eyes and teeth checked. Stay up to date on all vaccines. This information is not intended to replace advice given to you by your health care provider. Make sure you discuss any questions you have with your health care provider. Document Revised: 04/01/2021 Document Reviewed: 04/01/2021 Elsevier Patient Education  2024 [employer]  Managing Your Hypertension Hypertension, also called high blood pressure, is when the force of the blood pressing against the walls of the arteries is too strong. Arteries are blood vessels that carry blood from your heart throughout your body. Hypertension forces the heart to work harder to pump blood and may cause the arteries to become narrow or stiff. Understanding blood pressure readings A blood pressure reading includes a higher number over a lower number: The first, or top, number is called the systolic pressure. It is a  measure of the pressure in your arteries as your heart beats. The second, or bottom number, is called the diastolic pressure. It is a measure of the pressure in your arteries as the heart relaxes. For most people, a normal blood pressure is below 120/80. Your personal target blood pressure may vary depending on your medical conditions, your age, and other factors. Blood pressure is classified into four stages. Based on your blood pressure reading, your health care provider may use the following stages to determine what type of treatment you need, if any. Systolic pressure and diastolic pressure are measured in a unit called millimeters of mercury (mmHg). Normal Systolic pressure: below 120. Diastolic pressure: below 80. Elevated Systolic pressure: 120-129. Diastolic pressure: below 80. Hypertension stage 1 Systolic pressure: 130-139. Diastolic pressure: 80-89. Hypertension stage 2 Systolic pressure: 140 or above. Diastolic pressure: 90 or above. How can this condition affect me? Managing your hypertension is very important. Over time, hypertension can damage the arteries and decrease blood flow to parts of the body, including the brain, heart, and kidneys. Having untreated or uncontrolled hypertension can lead to: A heart attack. A stroke. A weakened blood vessel (aneurysm). Heart failure. Kidney damage. Eye damage. Memory and concentration problems. Vascular dementia. What actions can I take to manage this condition? Hypertension can be managed by making lifestyle changes  and possibly by taking medicines. Your health care provider will help you make a plan to bring your blood pressure within a normal range. You may be referred for counseling on a healthy diet and physical activity. Nutrition  Eat a diet that is high in fiber and potassium, and low in salt (sodium), added sugar, and fat. An example eating plan is called the DASH diet. DASH stands for Dietary Approaches to Stop  Hypertension. To eat this way: Eat plenty of fresh fruits and vegetables. Try to fill one-half of your plate at each meal with fruits and vegetables. Eat whole grains, such as whole-wheat pasta, brown rice, or whole-grain bread. Fill about one-fourth of your plate with whole grains. Eat low-fat dairy products. Avoid fatty cuts of meat, processed or cured meats, and poultry with skin. Fill about one-fourth of your plate with lean proteins such as fish, chicken without skin, beans, eggs, and tofu. Avoid pre-made and processed foods. These tend to be higher in sodium, added sugar, and fat. Reduce your daily sodium intake. Many people with hypertension should eat less than 1,500 mg of sodium a day. Lifestyle  Work with your health care provider to maintain a healthy body weight or to lose weight. Ask what an ideal weight is for you. Get at least 30 minutes of exercise that causes your heart to beat faster (aerobic exercise) most days of the week. Activities may include walking, swimming, or biking. Include exercise to strengthen your muscles (resistance exercise), such as weight lifting, as part of your weekly exercise routine. Try to do these types of exercises for 30 minutes at least 3 days a week. Do not use any products that contain nicotine or tobacco. These products include cigarettes, chewing tobacco, and vaping devices, such as e-cigarettes. If you need help quitting, ask your health care provider. Control any long-term (chronic) conditions you have, such as high cholesterol or diabetes. Identify your sources of stress and find ways to manage stress. This may include meditation, deep breathing, or making time for fun activities. Alcohol use Do not drink alcohol if: Your health care provider tells you not to drink. You are pregnant, may be pregnant, or are planning to become pregnant. If you drink alcohol: Limit how much you have to: 0-1 drink a day for women. 0-2 drinks a day for men. Know  how much alcohol is in your drink. In the [place] one drink equals one 12 oz bottle of beer (355 mL), one 5 oz glass of wine (148 mL), or one 1 oz glass of hard liquor (44 mL). Medicines Your health care provider may prescribe medicine if lifestyle changes are not enough to get your blood pressure under control and if: Your systolic blood pressure is 130 or higher. Your diastolic blood pressure is 80 or higher. Take medicines only as told by your health care provider. Follow the directions carefully. Blood pressure medicines must be taken as told by your health care provider. The medicine does not work as well when you skip doses. Skipping doses also puts you at risk for problems. Monitoring Before you monitor your blood pressure: Do not smoke, drink caffeinated beverages, or exercise within 30 minutes before taking a measurement. Use the bathroom and empty your bladder (urinate). Sit quietly for at least 5 minutes before taking measurements. Monitor your blood pressure at home as told by your health care provider. To do this: Sit with your back straight and supported. Place your feet flat on the floor. Do not cross  your legs. Support your arm on a flat surface, such as a table. Make sure your upper arm is at heart level. Each time you measure, take two or three readings one minute apart and record the results. You may also need to have your blood pressure checked regularly by your health care provider. General information Talk with your health care provider about your diet, exercise habits, and other lifestyle factors that may be contributing to hypertension. Review all the medicines you take with your health care provider because there may be side effects or interactions. Keep all follow-up visits. Your health care provider can help you create and adjust your plan for managing your high blood pressure. Where to find more information National Heart, Lung, and Blood Institute:  [employer] American Heart Association: www.heart.org Contact a health care provider if: You think you are having a reaction to medicines you have taken. You have repeated (recurrent) headaches. You feel dizzy. You have swelling in your ankles. You have trouble with your vision. Get help right away if: You develop a severe headache or confusion. You have unusual weakness or numbness, or you feel faint. You have severe pain in your chest or abdomen. You vomit repeatedly. You have trouble breathing. These symptoms may be an emergency. Get help right away. Call 911. Do not wait to see if the symptoms will go away. Do not drive yourself to the hospital. Summary Hypertension is when the force of blood pumping through your arteries is too strong. If this condition is not controlled, it may put you at risk for serious complications. Your personal target blood pressure may vary depending on your medical conditions, your age, and other factors. For most people, a normal blood pressure is less than 120/80. Hypertension is managed by lifestyle changes, medicines, or both. Lifestyle changes to help manage hypertension include losing weight, eating a healthy, low-sodium diet, exercising more, stopping smoking, and limiting alcohol. This information is not intended to replace advice given to you by your health care provider. Make sure you discuss any questions you have with your health care provider. Document Revised: 06/18/2021 Document Reviewed: 06/18/2021 Elsevier Patient Education  2024 [employer].
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
## 2024-07-15 ENCOUNTER — Ambulatory Visit: Payer: Self-pay | Admitting: Family Medicine

## 2024-08-01 ENCOUNTER — Telehealth: Payer: Self-pay

## 2024-08-01 NOTE — Telephone Encounter (Signed)
 Tried calling patient but line was busy x2. Unable to leave vm to return call. Notes are in the referral but I will past them here   Please contact patient.  We have received information that the office where they have been referred has tried to contact patient but unsuccessful.  Can provide patient with their contact information so they can schedule on their own for the referral, or let us  know if they no longer want to be seen by that provider and we can close the referral.  Let me know if there are questions.    Copied from CRM #[id number]. Topic: General - Other >> Aug 01, 2024  2:16 PM Eva FALCON wrote: Reason for CRM: pt is returning call for [facility]? I did not see a note in chart about a call. I tried reaching CAL but no answer. Told pt I would send message to have someone call back. [phone].

## 2024-08-02 NOTE — Telephone Encounter (Signed)
 I reached patient. He said that he received letter in the mail and will be reaching out to pulm to schedule appt. FYI

## 2025-01-09 ENCOUNTER — Ambulatory Visit: Admitting: Family Medicine
